# Patient Record
Sex: Male | Born: 1940 | Race: White | Hispanic: No | Smoking: Never smoker
Health system: Southern US, Community
[De-identification: ages and names within clinical notes are randomized; demographics above are authoritative.]

## PROBLEM LIST (undated history)

## (undated) DIAGNOSIS — R011 Cardiac murmur, unspecified: Secondary | ICD-10-CM

## (undated) DIAGNOSIS — M1990 Unspecified osteoarthritis, unspecified site: Secondary | ICD-10-CM

## (undated) DIAGNOSIS — A0472 Enterocolitis due to Clostridium difficile, not specified as recurrent: Secondary | ICD-10-CM

## (undated) DIAGNOSIS — Z950 Presence of cardiac pacemaker: Secondary | ICD-10-CM

## (undated) DIAGNOSIS — I350 Nonrheumatic aortic (valve) stenosis: Secondary | ICD-10-CM

## (undated) DIAGNOSIS — Z87442 Personal history of urinary calculi: Secondary | ICD-10-CM

## (undated) DIAGNOSIS — I10 Essential (primary) hypertension: Secondary | ICD-10-CM

## (undated) DIAGNOSIS — E119 Type 2 diabetes mellitus without complications: Secondary | ICD-10-CM

## (undated) DIAGNOSIS — G119 Hereditary ataxia, unspecified: Secondary | ICD-10-CM

## (undated) DIAGNOSIS — K5792 Diverticulitis of intestine, part unspecified, without perforation or abscess without bleeding: Secondary | ICD-10-CM

## (undated) HISTORY — PX: INSERT / REPLACE / REMOVE PACEMAKER: SUR710

## (undated) HISTORY — PX: CARDIAC CATHETERIZATION: SHX172

## (undated) HISTORY — PX: HERNIA REPAIR: SHX51

## (undated) HISTORY — PX: TONSILLECTOMY: SUR1361

## (undated) HISTORY — PX: SHOULDER ARTHROSCOPY WITH OPEN ROTATOR CUFF REPAIR: SHX6092

## (undated) HISTORY — PX: UMBILICAL HERNIA REPAIR: SHX196

## (undated) HISTORY — PX: COLECTOMY: SHX59

---
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
## 1997-06-03 ENCOUNTER — Ambulatory Visit (HOSPITAL_COMMUNITY): Admission: RE | Admit: 1997-06-03 | Discharge: 1997-06-03 | Payer: Self-pay | Admitting: Family Medicine

## 2016-08-06 ENCOUNTER — Encounter (HOSPITAL_COMMUNITY): Payer: Self-pay | Admitting: General Practice

## 2016-08-06 ENCOUNTER — Inpatient Hospital Stay (HOSPITAL_COMMUNITY)
Admission: AD | Admit: 2016-08-06 | Discharge: 2016-08-10 | DRG: 445 | Disposition: A | Discharge status: Home Health | Payer: Medicare Other | Source: Other Acute Inpatient Hospital | Attending: Internal Medicine | Admitting: Internal Medicine

## 2016-08-06 DIAGNOSIS — M19042 Primary osteoarthritis, left hand: Secondary | ICD-10-CM | POA: Diagnosis present

## 2016-08-06 DIAGNOSIS — I10 Essential (primary) hypertension: Secondary | ICD-10-CM | POA: Diagnosis present

## 2016-08-06 DIAGNOSIS — Z950 Presence of cardiac pacemaker: Secondary | ICD-10-CM | POA: Diagnosis not present

## 2016-08-06 DIAGNOSIS — Z66 Do not resuscitate: Secondary | ICD-10-CM | POA: Diagnosis present

## 2016-08-06 DIAGNOSIS — Z993 Dependence on wheelchair: Secondary | ICD-10-CM | POA: Diagnosis not present

## 2016-08-06 DIAGNOSIS — R1011 Right upper quadrant pain: Secondary | ICD-10-CM

## 2016-08-06 DIAGNOSIS — G119 Hereditary ataxia, unspecified: Secondary | ICD-10-CM | POA: Diagnosis present

## 2016-08-06 DIAGNOSIS — K810 Acute cholecystitis: Secondary | ICD-10-CM

## 2016-08-06 DIAGNOSIS — Z87442 Personal history of urinary calculi: Secondary | ICD-10-CM | POA: Diagnosis not present

## 2016-08-06 DIAGNOSIS — E119 Type 2 diabetes mellitus without complications: Secondary | ICD-10-CM | POA: Diagnosis present

## 2016-08-06 DIAGNOSIS — I350 Nonrheumatic aortic (valve) stenosis: Secondary | ICD-10-CM | POA: Diagnosis present

## 2016-08-06 DIAGNOSIS — D72829 Elevated white blood cell count, unspecified: Secondary | ICD-10-CM

## 2016-08-06 DIAGNOSIS — K8000 Calculus of gallbladder with acute cholecystitis without obstruction: Secondary | ICD-10-CM | POA: Diagnosis present

## 2016-08-06 DIAGNOSIS — E039 Hypothyroidism, unspecified: Secondary | ICD-10-CM | POA: Diagnosis present

## 2016-08-06 DIAGNOSIS — F17220 Nicotine dependence, chewing tobacco, uncomplicated: Secondary | ICD-10-CM | POA: Diagnosis present

## 2016-08-06 DIAGNOSIS — R011 Cardiac murmur, unspecified: Secondary | ICD-10-CM | POA: Diagnosis present

## 2016-08-06 DIAGNOSIS — K8020 Calculus of gallbladder without cholecystitis without obstruction: Secondary | ICD-10-CM | POA: Diagnosis not present

## 2016-08-06 HISTORY — DX: Cardiac murmur, unspecified: R01.1

## 2016-08-06 HISTORY — DX: Diverticulitis of intestine, part unspecified, without perforation or abscess without bleeding: K57.92

## 2016-08-06 HISTORY — DX: Nonrheumatic aortic (valve) stenosis: I35.0

## 2016-08-06 HISTORY — DX: Essential (primary) hypertension: I10

## 2016-08-06 HISTORY — DX: Personal history of urinary calculi: Z87.442

## 2016-08-06 HISTORY — DX: Enterocolitis due to Clostridium difficile, not specified as recurrent: A04.72

## 2016-08-06 HISTORY — DX: Unspecified osteoarthritis, unspecified site: M19.90

## 2016-08-06 HISTORY — DX: Presence of cardiac pacemaker: Z95.0

## 2016-08-06 HISTORY — DX: Type 2 diabetes mellitus without complications: E11.9

## 2016-08-06 HISTORY — DX: Hereditary ataxia, unspecified: G11.9

## 2016-08-06 LAB — GLUCOSE, CAPILLARY: GLUCOSE-CAPILLARY: 146 mg/dL — AB (ref 65–99)

## 2016-08-06 MED ORDER — INSULIN ASPART 100 UNIT/ML ~~LOC~~ SOLN
0.0000 [IU] | Freq: Three times a day (TID) | SUBCUTANEOUS | Status: DC
Start: 2016-08-07 — End: 2016-08-10
  Administered 2016-08-07: 2 [IU] via SUBCUTANEOUS
  Administered 2016-08-08 – 2016-08-09 (×2): 3 [IU] via SUBCUTANEOUS
  Administered 2016-08-09 – 2016-08-10 (×3): 2 [IU] via SUBCUTANEOUS

## 2016-08-06 MED ORDER — ONDANSETRON HCL 4 MG/2ML IJ SOLN: 4.0000 mg | Freq: Four times a day (QID) | INTRAMUSCULAR | Status: DC | PRN

## 2016-08-06 MED ORDER — GABAPENTIN 100 MG PO CAPS: 100.0000 mg | ORAL_CAPSULE | Freq: Three times a day (TID) | ORAL | Status: DC | PRN

## 2016-08-06 MED ORDER — SODIUM CHLORIDE 0.9 % IV SOLN
250.0000 mL | INTRAVENOUS | Status: DC | PRN
  Administered 2016-08-07: 250 mL via INTRAVENOUS

## 2016-08-06 MED ORDER — PIPERACILLIN-TAZOBACTAM 3.375 G IVPB
3.3750 g | Freq: Three times a day (TID) | INTRAVENOUS | Status: DC
  Administered 2016-08-07 – 2016-08-09 (×8): 3.375 g via INTRAVENOUS
  Filled 2016-08-06 (×9): qty 50

## 2016-08-06 MED ORDER — DIGOXIN 125 MCG PO TABS
0.2500 mg | ORAL_TABLET | Freq: Every day | ORAL | Status: DC
  Administered 2016-08-07 – 2016-08-10 (×4): 0.25 mg via ORAL
  Filled 2016-08-06 (×4): qty 2

## 2016-08-06 MED ORDER — MORPHINE SULFATE (PF) 2 MG/ML IV SOLN
2.0000 mg | INTRAVENOUS | Status: DC | PRN
  Administered 2016-08-08 (×2): 2 mg via INTRAVENOUS
  Filled 2016-08-06 (×2): qty 1

## 2016-08-06 MED ORDER — BOOST / RESOURCE BREEZE PO LIQD: 1.0000 | Freq: Three times a day (TID) | ORAL | Status: DC

## 2016-08-06 MED ORDER — ACETAMINOPHEN 325 MG PO TABS: 650.0000 mg | ORAL_TABLET | Freq: Four times a day (QID) | ORAL | Status: DC | PRN

## 2016-08-06 MED ORDER — LEVOTHYROXINE SODIUM 50 MCG PO TABS
50.0000 ug | ORAL_TABLET | Freq: Every day | ORAL | Status: DC
  Administered 2016-08-07 – 2016-08-10 (×3): 50 ug via ORAL
  Filled 2016-08-06 (×3): qty 1

## 2016-08-06 MED ORDER — AMLODIPINE BESYLATE 10 MG PO TABS
10.0000 mg | ORAL_TABLET | Freq: Every day | ORAL | Status: DC
  Administered 2016-08-07 – 2016-08-10 (×4): 10 mg via ORAL
  Filled 2016-08-06 (×4): qty 1

## 2016-08-06 MED ORDER — ACETAMINOPHEN 650 MG RE SUPP: 650.0000 mg | Freq: Four times a day (QID) | RECTAL | Status: DC | PRN

## 2016-08-06 MED ORDER — PIPERACILLIN-TAZOBACTAM 3.375 G IVPB 30 MIN
3.3750 g | INTRAVENOUS | Status: AC
  Administered 2016-08-07: 3.375 g via INTRAVENOUS
  Filled 2016-08-06: qty 50

## 2016-08-06 MED ORDER — SODIUM CHLORIDE 0.9% FLUSH
3.0000 mL | Freq: Two times a day (BID) | INTRAVENOUS | Status: DC
  Administered 2016-08-07 – 2016-08-10 (×5): 3 mL via INTRAVENOUS

## 2016-08-06 MED ORDER — SODIUM CHLORIDE 0.9% FLUSH
3.0000 mL | Freq: Two times a day (BID) | INTRAVENOUS | Status: DC
  Administered 2016-08-09: 3 mL via INTRAVENOUS

## 2016-08-06 MED ORDER — SODIUM CHLORIDE 0.9% FLUSH: 3.0000 mL | INTRAVENOUS | Status: DC | PRN

## 2016-08-06 MED ORDER — LATANOPROST 0.005 % OP SOLN
1.0000 [drp] | Freq: Every day | OPHTHALMIC | Status: DC
  Administered 2016-08-07 – 2016-08-08 (×2): 1 [drp] via OPHTHALMIC
  Filled 2016-08-06 (×2): qty 2.5

## 2016-08-06 MED ORDER — ENOXAPARIN SODIUM 40 MG/0.4ML ~~LOC~~ SOLN
40.0000 mg | Freq: Every day | SUBCUTANEOUS | Status: DC
  Administered 2016-08-07: 40 mg via SUBCUTANEOUS
  Filled 2016-08-06 (×2): qty 0.4

## 2016-08-06 MED ORDER — HYDROCODONE-ACETAMINOPHEN 5-325 MG PO TABS
1.0000 | ORAL_TABLET | ORAL | Status: DC | PRN
  Administered 2016-08-08 – 2016-08-09 (×4): 2 via ORAL
  Filled 2016-08-06 (×4): qty 2

## 2016-08-06 MED ORDER — INSULIN DETEMIR 100 UNIT/ML ~~LOC~~ SOLN
8.0000 [IU] | Freq: Every day | SUBCUTANEOUS | Status: DC
  Administered 2016-08-07 – 2016-08-09 (×4): 8 [IU] via SUBCUTANEOUS
  Filled 2016-08-06 (×5): qty 0.08

## 2016-08-06 NOTE — Progress Notes (Signed)
Pharmacy Antibiotic Note  [name] is a 76 y.o. male admitted on 08/06/2016 with intra-abd infection.  Pharmacy has been consulted for Zosyn dosing.  Plan: Zosyn 3.375gm IV now over 30 min then 3.375gm IV q8h - subsequent doses over 4 hours Will f/u micro data, renal function, and pt's clinical condition     No data recorded.  No results for input(s): WBC, CREATININE, LATICACIDVEN, VANCOTROUGH, VANCOPEAK, VANCORANDOM, GENTTROUGH, GENTPEAK, GENTRANDOM, TOBRATROUGH, TOBRAPEAK, TOBRARND, AMIKACINPEAK, AMIKACINTROU, AMIKACIN in the last 168 hours.  CrCl cannot be calculated (No order found.).    Not on File  Antimicrobials this admission: 6/16 Zosyn >>   Microbiology results: Pending  Thank you for allowing pharmacy to be a part of this patient's care.  Christoper Fabianaron Megean Fabio, PharmD, BCPS Clinical pharmacist, pager [phone] 08/06/2016 11:43 PM

## 2016-08-06 NOTE — H&P (Signed)
History and Physical    Benjamin Thornton ZOX:[id number] DOB: 1940/02/24 DOA: 08/06/2016  PCP: Rudi Heap, MD   Patient coming from: [facility]  Chief Complaint: Transfer for surgical consultation  HPI: Benjamin Thornton is a 76 y.o. 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  Symptoms have been present since Wednesday.  He has had nausea but no vomiting.  No fevers, chills, or sweats.  No shortness of breath, swelling, syncope.  He was concerned that he was having an episode of pancreatitis.  Work-up in the ED in [place] showed leukocytosis of 22.  Normal lipase.  Lactic acid 1.7.  U/A was negative for infection.  Chest xray showed low lung volumes and possible early edema.  Troponin negative.  EKG showed inferolateral T wave inversions with ST depressions.  RUQ ultrasound showed stones and sludge but no pericholecystic fluid, no wall thickening, and no sonographic Murphy's sign.  He receive IV zosyn and morphine for pain.  He is actually pain free at the time of admission to [facility].  Review of Systems: His caregiver (who is his ex-wife) reports an episode of transient decreased level of responsiveness while eating approximately two weeks ago.  The patient was pale but he did not lose consciousness.  He reported feeling "weird" for several seconds, but he could not localize his complaint or explain it any further.  No specific action was taken then.  Otherwise, 10 systems reviewed and negative.   Past Medical History:  Diagnosis Date  . Arthritis    "left knuckle" (08/06/2016)  . 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 C. difficile colitis   . Diabetes mellitus without  complication (HCC) dx'd 1990s   "I've never taken pills; I've always used the pen" (08/06/2016)  . Diverticulitis   . Heart murmur   . History of kidney stones    "passed them"  . Hypertension   . Presence of permanent cardiac pacemaker   . Severe aortic stenosis     Past Surgical History:  Procedure Laterality Date  . CARDIAC CATHETERIZATION    . COLECTOMY  1990s   partial; "12 inches; diverticulitis"  . HERNIA REPAIR    . INSERT / REPLACE / REMOVE PACEMAKER  2012; 01/2016  . SHOULDER ARTHROSCOPY WITH OPEN ROTATOR CUFF REPAIR Left   . TESTICLE SURGERY Right 1949   it wasn't descended  . TONSILLECTOMY    . UMBILICAL HERNIA REPAIR       reports that he has never smoked. His smokeless tobacco use includes Chew. He reports that he does not drink alcohol or use drugs.  He has one adult son.  ALLERGIES: Records from [place] indicate an iodine allergy but the patient (and his son) deny this.    Family History: His sister also has cerebellar ataxia.  Prior to Admission medications   Not on File  Reviewed in the records from the outside hospital.  Limited orders given.  Pharmacy will need to review.  Physical Exam: Vitals:   08/06/16 2039 08/07/16 0501  BP: (!) 134/48 (!) 118/45  Pulse: 70 61  Resp:  18  Temp: 99.6 F (37.6 C) 99.3 F (37.4 C)  TempSrc: Oral   SpO2: 100% 99%  Constitutional: NAD, calm, comfortable Vitals:   08/06/16 2039 08/07/16 0501  BP: (!) 134/48 (!) 118/45  Pulse: 70 61  Resp:  18  Temp: 99.6 F (37.6 C) 99.3 F (37.4 C)  TempSrc: Oral   SpO2: 100% 99%   Eyes: PERRL, lids and conjunctivae normal ENMT: Mucous membranes are moist. Posterior pharynx clear of any exudate or lesions. Normal dentition.  Neck: normal appearance, supple, no masses Respiratory: clear to auscultation bilaterally, no wheezing, no crackles. Normal respiratory effort. No accessory muscle use.  Cardiovascular: Normal rate, regular rhythm, Loud systolic murmur  heard throughout the precordium with radiation to his right carotid artery.  No extremity edema. 2+ pedal pulses. GI: abdomen is soft and compressible.  No distention.  No tenderness.  No guarding.  Bowel sounds are hypoactive. Musculoskeletal:  No joint deformity in upper and lower extremities. Good ROM, no contractures. Normal muscle tone.  Skin: no rashes, pale, cool, dry Neurologic: No apparent focal deficits. Psychiatric: Normal judgment and insight. Alert and oriented x 3. Normal mood.     Labs on Admission: I have personally reviewed following labs from the outside hospital.   Assessment/Plan Active Problems:   RUQ pain   Gallstones   Leukocytosis   Aortic stenosis   Cerebellar ataxia (HCC)   Pacemaker     RUQ pain with leukocytosis and abnormal GB on ultrasound.  No evidence of sepsis. --General Surgery consult called (Dr. Lindie SpruceWyatt).  Patient needs HIDA scan in the AM. --Continue empiric zosyn for now --Clear liquid diet then NPO after midnight --He is a higher risk surgical candidate with his history of AS  AS --Complete echo in the AM --Currently asymptomatic --No evidence of CHF  History of PPM (? Afib) --On digoxin at home but no anticoagulation except baby aspirin  HTN --Amlodipine, toprol  DM --Half dose of levemir while NPO  Hypothyroidism --levothyroxine  Cerebellar ataxia --wheelchair bound at baseline    DVT prophylaxis: Lovenox Code Status: DNR Family Communication: Son and caregiver (ex-wife) present at bedside at time of admission. Disposition Plan: To be determined. Consults called: General Surgery Lindie Spruce(Wyatt).   Admission status: Inpatient, telemetry.  I expect this patient will need inpatient services for greater than two midnights.   TIME SPENT: 60 minutes   Jerene Bearsarter,Kyeisha Janowicz Harrison MD [facility] Pager [phone]  If 7PM-7AM, please contact night-coverage [email or web address] Password [email or web address]  08/06/2016, 10:47 PM

## 2016-08-07 ENCOUNTER — Inpatient Hospital Stay (HOSPITAL_COMMUNITY): Payer: Medicare Other

## 2016-08-07 DIAGNOSIS — I350 Nonrheumatic aortic (valve) stenosis: Secondary | ICD-10-CM

## 2016-08-07 DIAGNOSIS — R1011 Right upper quadrant pain: Secondary | ICD-10-CM

## 2016-08-07 DIAGNOSIS — K8020 Calculus of gallbladder without cholecystitis without obstruction: Secondary | ICD-10-CM

## 2016-08-07 DIAGNOSIS — G119 Hereditary ataxia, unspecified: Secondary | ICD-10-CM

## 2016-08-07 LAB — CBC WITH DIFFERENTIAL/PLATELET
BASOS PCT: 1 %
Basophils Absolute: 0.1 10*3/uL (ref 0.0–0.1)
Eosinophils Absolute: 0.2 10*3/uL (ref 0.0–0.7)
Eosinophils Relative: 1 %
HEMATOCRIT: 37.5 % — AB (ref 39.0–52.0)
HEMOGLOBIN: 13.7 g/dL (ref 13.0–17.0)
LYMPHS ABS: 2.2 10*3/uL (ref 0.7–4.0)
LYMPHS PCT: 12 %
MCH: 31.2 pg (ref 26.0–34.0)
MCHC: 36.5 g/dL — AB (ref 30.0–36.0)
MCV: 85.4 fL (ref 78.0–100.0)
MONO ABS: 1.7 10*3/uL — AB (ref 0.1–1.0)
MONOS PCT: 10 %
NEUTROS ABS: 13.6 10*3/uL — AB (ref 1.7–7.7)
NEUTROS PCT: 76 %
Platelets: 151 10*3/uL (ref 150–400)
RBC: 4.39 MIL/uL (ref 4.22–5.81)
RDW: 14.2 % (ref 11.5–15.5)
WBC: 17.8 10*3/uL — ABNORMAL HIGH (ref 4.0–10.5)

## 2016-08-07 LAB — COMPREHENSIVE METABOLIC PANEL
ALBUMIN: 3.2 g/dL — AB (ref 3.5–5.0)
ALT: 106 U/L — AB (ref 17–63)
AST: 82 U/L — AB (ref 15–41)
Alkaline Phosphatase: 133 U/L — ABNORMAL HIGH (ref 38–126)
Anion gap: 8 (ref 5–15)
BUN: 21 mg/dL — AB (ref 6–20)
CHLORIDE: 106 mmol/L (ref 101–111)
CO2: 26 mmol/L (ref 22–32)
CREATININE: 1.33 mg/dL — AB (ref 0.61–1.24)
Calcium: 8.2 mg/dL — ABNORMAL LOW (ref 8.9–10.3)
GFR calc Af Amer: 58 mL/min — ABNORMAL LOW (ref >60)
GFR, EST NON AFRICAN AMERICAN: 50 mL/min — AB (ref >60)
GLUCOSE: 115 mg/dL — AB (ref 65–99)
POTASSIUM: 4.1 mmol/L (ref 3.5–5.1)
Sodium: 140 mmol/L (ref 135–145)
Total Bilirubin: 3 mg/dL — ABNORMAL HIGH (ref 0.3–1.2)
Total Protein: 6.2 g/dL — ABNORMAL LOW (ref 6.5–8.1)

## 2016-08-07 LAB — PROTIME-INR
INR: 1.36
Prothrombin Time: 16.9 seconds — ABNORMAL HIGH (ref 11.4–15.2)

## 2016-08-07 LAB — ECHOCARDIOGRAM COMPLETE
HEIGHTINCHES: 71 in
Weight: 2582.03 oz

## 2016-08-07 LAB — GLUCOSE, CAPILLARY
GLUCOSE-CAPILLARY: 85 mg/dL (ref 65–99)
GLUCOSE-CAPILLARY: 138 mg/dL — AB (ref 65–99)
Glucose-Capillary: 109 mg/dL — ABNORMAL HIGH (ref 65–99)
Glucose-Capillary: 138 mg/dL — ABNORMAL HIGH (ref 65–99)

## 2016-08-07 MED ORDER — SODIUM CHLORIDE 0.9 % IV SOLN
2.9000 mg/h | Freq: Once | INTRAVENOUS | Status: DC
  Administered 2016-08-07: 2.9 mg/h via INTRAVENOUS

## 2016-08-07 MED ORDER — DEXTROSE-NACL 5-0.45 % IV SOLN
INTRAVENOUS | Status: DC
  Administered 2016-08-07 – 2016-08-08 (×2): 1000 mL via INTRAVENOUS

## 2016-08-07 MED ORDER — TECHNETIUM TC 99M MEBROFENIN IV KIT
5.4400 | PACK | Freq: Once | INTRAVENOUS | Status: AC | PRN
  Administered 2016-08-07: 5.44 via INTRAVENOUS

## 2016-08-07 MED ORDER — DEXTROSE-NACL 5-0.45 % IV SOLN
INTRAVENOUS | Status: DC
  Administered 2016-08-07: 06:00:00 via INTRAVENOUS

## 2016-08-07 MED ORDER — MORPHINE SULFATE (PF) 4 MG/ML IV SOLN
INTRAVENOUS | Status: AC
  Filled 2016-08-07: qty 1

## 2016-08-07 MED ORDER — ENSURE ENLIVE PO LIQD
237.0000 mL | Freq: Two times a day (BID) | ORAL | Status: DC
  Administered 2016-08-08 – 2016-08-10 (×4): 237 mL via ORAL

## 2016-08-07 NOTE — Progress Notes (Signed)
Uvaldo Bristleatjana, RN called from [facility] with results of blood culture.  Results indicated gram positive cocci findings. Adline MangoIvana, RN requested lab results to be faxed to [facility]. Fax number provided to Uvaldo Bristleatjana, RN she will request paperwork from lab and sent it to the unit.

## 2016-08-07 NOTE — Progress Notes (Signed)
PROGRESS NOTE    Benjamin Thornton  [id number] DOB: [birth year] DOA: 08/06/2016 PCP: Rudi Thornton, [name], MD  Brief Narrative:Benjamin Thornton is a 76 y.o. 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 RUQ ultrasound showed stones and sludge but no pericholecystic fluid, no wall thickening, and no sonographic Murphy's sign.     Assessment & Plan: 1. Cholelithiasis with possible acute cholecystitis -Appreciate surgical consult -HIDA scan today, surgery recommended percutaneous drainage if cystic duct obstructed, not felt to be a surgical candidate because of severe aortic stenosis  2. Severe aortic stenosis  -Followed by a cardiologist in [place] -Managed conservatively due to her overall poor prognosis with debility and multiple medical problems -Follow-up repeat echo--Currently asymptomatic --No evidence of CHF -History of PPM  --On digoxin at home.  3. HTN --Amlodipine, toprol  4. DM --Half dose of levemir while NPO, SSI, CBGs stable  5. 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   DVT prophylaxis: Lovenox Code Status: DNR Family Communication: Son and caregiver (ex-wife) present at bedside  Dispo: pending stability and above Rx  Consultants:   ccs  Antimicrobials:   Zosyn   Subjective: abd pain better, no N/V, no dyspnea or chest pain  Objective: Vitals:   08/06/16 2039 08/07/16 0501  BP: (!) 134/48 (!) 118/45  Pulse: 70 61  Resp:  18  Temp: 99.6 F (37.6 C) 99.3 F (37.4 C)  TempSrc: Oral   SpO2: 100% 99%  Weight:  73.2 kg (161 lb 6 oz)  Height:  5\' 11"  (1.803 m)    Intake/Output Summary (Last 24 hours) at 08/07/16 1148 Last data filed at 08/07/16 1017  Gross per 24 hour  Intake           208.16 ml  Output                0 ml  Net           208.16 ml   Filed Weights   08/07/16 0501  Weight: 73.2 kg (161 lb 6 oz)    Examination:  General exam: Appears calm and comfortable  Respiratory system: Clear to auscultation. Respiratory effort normal. Cardiovascular system: S1 & S2 heard, garde 4 systolic murmur Gastrointestinal system: Abdomen is nondistended, soft and tender in RUQ. Normal bowel sounds heard. Central nervous system: Alert and oriented. chronicincoordination Extremities: Symmetric 5 x 5 power. Skin: No rashes, lesions or ulcers Psychiatry: Judgement and insight appear normal. Mood & affect appropriate.     Data Reviewed:   CBC:  Recent Labs Lab 08/07/16 0656  WBC 17.8*  NEUTROABS 13.6*  HGB 13.7  HCT 37.5*  MCV 85.4  PLT 151   Basic Metabolic Panel:  Recent Labs Lab 08/07/16 0656  NA 140  K 4.1  CL 106  CO2 26  GLUCOSE 115*  BUN 21*  CREATININE 1.33*  CALCIUM 8.2*   GFR: Estimated Creatinine Clearance: 48.2 mL/min (A) (by C-G formula based on SCr of 1.33 mg/dL (H)). Liver Function Tests:  Recent Labs Lab 08/07/16 0656  AST 82*  ALT 106*  ALKPHOS 133*  BILITOT 3.0*  PROT 6.2*  ALBUMIN 3.2*   No results for input(s): LIPASE, AMYLASE in the last 168 hours. No results for input(s): AMMONIA in the last 168 hours. Coagulation Profile:  Recent Labs Lab 08/07/16 0656  INR 1.36   Cardiac Enzymes: No results for input(s): CKTOTAL, CKMB, CKMBINDEX, TROPONINI in the last 168 hours. BNP (last 3 results) No results for input(s): PROBNP in the last 8760 hours. HbA1C: No results for input(s): HGBA1C in the last 72 hours. CBG:  Recent Labs Lab 08/06/16 2310 08/07/16 0833  GLUCAP 146* 109*   Lipid Profile: No results for input(s): CHOL, HDL, LDLCALC, TRIG, CHOLHDL, LDLDIRECT in the last 72 hours. Thyroid Function Tests: No results for input(s): TSH, T4TOTAL, FREET4, T3FREE, THYROIDAB in  the last 72 hours. Anemia Panel: No results for input(s): VITAMINB12, FOLATE, FERRITIN, TIBC, IRON, RETICCTPCT in the last 72 hours. Urine analysis: No results found for: COLORURINE, APPEARANCEUR, LABSPEC, PHURINE, GLUCOSEU, HGBUR, BILIRUBINUR, KETONESUR, PROTEINUR, UROBILINOGEN, NITRITE, LEUKOCYTESUR Sepsis Labs: @LABRCNTIP (procalcitonin:4,lacticidven:4)  )No results found for this or any previous visit (from the past 240 hour(s)).       Radiology Studies: No results found.      Scheduled Meds: . amLODipine  10 mg Oral Daily  . digoxin  0.25 mg Oral Daily  . enoxaparin (LOVENOX) injection  40 mg Subcutaneous QHS  . feeding supplement  1 Container Oral TID BM  . insulin aspart  0-15 Units Subcutaneous TID WC  . insulin detemir  8 Units Subcutaneous QHS  . latanoprost  1 drop Both Eyes QHS  . levothyroxine  50 mcg Oral QAC breakfast  . sodium chloride flush  3 mL Intravenous Q12H  . sodium chloride flush  3 mL Intravenous Q12H   Continuous Infusions: . sodium chloride 250 mL (08/07/16 0000)  . dextrose 5 % and 0.45% NaCl 1,000 mL (08/07/16 1021)  . piperacillin-tazobactam (ZOSYN)  IV Stopped (08/07/16 1143)     LOS: 1 day    Time spent:    Zannie Cove, MD [facility] Pager [phone]  If 7PM-7AM, please contact night-coverage [email or web address] Password [facility] 08/07/2016, 11:48 AM

## 2016-08-07 NOTE — Consult Note (Signed)
Reason for Consult:Gallstones and RUQ pain Referring Physician: [name] is an 76 y.o. male.  HPI: Abdominal pain, has had before, this time is worse.  US shows stone with sludge, no thickening or pericholecystic fluid.    Past Medical History:  Diagnosis Date  . Arthritis    "left knuckle" (08/06/2016)  . 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 C. difficile colitis   . Diabetes mellitus without complication ([place]) dx'd 3419Q   "I've never taken pills; I've always used the pen" (08/06/2016)  . Diverticulitis   . Heart murmur   . History of kidney stones    "passed them"  . Hypertension   . Presence of permanent cardiac pacemaker   . Severe aortic stenosis     Past Surgical History:  Procedure Laterality Date  . CARDIAC CATHETERIZATION    . COLECTOMY  1990s   partial; "12 inches; diverticulitis"  . HERNIA REPAIR    . INSERT / REPLACE / REMOVE PACEMAKER  2012; 01/2016  . SHOULDER ARTHROSCOPY WITH OPEN ROTATOR CUFF REPAIR Left   . TESTICLE SURGERY Right 1949   it wasn't descended  . TONSILLECTOMY    . UMBILICAL HERNIA REPAIR      History reviewed. No pertinent family history.  Social History:  reports that he has never smoked. His smokeless tobacco use includes Chew. He reports that he does not drink alcohol or use drugs.  Allergies: Not on File  Medications: I have reviewed the patient's current medications.  Results for orders placed or performed during the hospital encounter of 08/06/16 (from the past 48 hour(s))  Glucose, capillary     Status: Abnormal   Collection Time: 08/06/16 11:10 PM  Result Value Ref Range   Glucose-Capillary 146 (H) 65 - 99 mg/dL  Comprehensive metabolic panel     Status: Abnormal   Collection Time: 08/07/16  6:56 AM  Result Value Ref Range   Sodium 140 135 - 145 mmol/L   Potassium 4.1 3.5 - 5.1 mmol/L   Chloride 106 101 - 111 mmol/L   CO2 26 22 - 32 mmol/L   Glucose, Bld 115 (H) 65 -  99 mg/dL   BUN 21 (H) 6 - 20 mg/dL   Creatinine, Ser 1.33 (H) 0.61 - 1.24 mg/dL   Calcium 8.2 (L) 8.9 - 10.3 mg/dL   Total Protein 6.2 (L) 6.5 - 8.1 g/dL   Albumin 3.2 (L) 3.5 - 5.0 g/dL   AST 82 (H) 15 - 41 U/L   ALT 106 (H) 17 - 63 U/L   Alkaline Phosphatase 133 (H) 38 - 126 U/L   Total Bilirubin 3.0 (H) 0.3 - 1.2 mg/dL   GFR calc non Af Amer 50 (L) >60 mL/min   GFR calc Af Amer 58 (L) >60 mL/min    Comment: (NOTE) The eGFR has been calculated using the CKD EPI equation. This calculation has not been validated in all clinical situations. eGFR's persistently <60 mL/min signify possible Chronic Kidney Disease.    Anion gap 8 5 - 15  Protime-INR     Status: Abnormal   Collection Time: 08/07/16  6:56 AM  Result Value Ref Range   Prothrombin Time 16.9 (H) 11.4 - 15.2 seconds   INR 1.36   CBC WITH DIFFERENTIAL     Status: Abnormal   Collection Time: 08/07/16  6:56 AM  Result Value Ref Range   WBC 17.8 (H) 4.0 - 10.5 K/uL   RBC  4.39 4.22 - 5.81 MIL/uL   Hemoglobin 13.7 13.0 - 17.0 g/dL   HCT 37.5 (L) 39.0 - 52.0 %   MCV 85.4 78.0 - 100.0 fL   MCH 31.2 26.0 - 34.0 pg   MCHC 36.5 (H) 30.0 - 36.0 g/dL   RDW 14.2 11.5 - 15.5 %   Platelets 151 150 - 400 K/uL   Neutrophils Relative % 76 %   Neutro Abs 13.6 (H) 1.7 - 7.7 K/uL   Lymphocytes Relative 12 %   Lymphs Abs 2.2 0.7 - 4.0 K/uL   Monocytes Relative 10 %   Monocytes Absolute 1.7 (H) 0.1 - 1.0 K/uL   Eosinophils Relative 1 %   Eosinophils Absolute 0.2 0.0 - 0.7 K/uL   Basophils Relative 1 %   Basophils Absolute 0.1 0.0 - 0.1 K/uL    No results found.  Review of Systems  Constitutional: Negative for chills and fever.  Gastrointestinal: Positive for abdominal pain and nausea.   Blood pressure (!) 118/45, pulse 61, temperature 99.3 F (37.4 C), resp. rate 18, height '5\' 11"'  (1.803 m), weight 73.2 kg (161 lb 6 oz), SpO2 99 %. Physical Exam  Constitutional: He is oriented to person, place, and time. He appears  well-developed and well-nourished.  HENT:  Head: Normocephalic and atraumatic.  Eyes: EOM are normal. Pupils are equal, round, and reactive to light.  Neck: Normal range of motion.  Cardiovascular: Normal rate and regular rhythm.   Murmur (Grade IV/VI systolic murmur) heard. Has pacemaker.  Respiratory: Effort normal and breath sounds normal.  GI: Soft. Normal appearance and bowel sounds are normal. There is tenderness in the right upper quadrant. There is positive Murphy's sign.  Musculoskeletal: Normal range of motion.  Neurological: He is alert and oriented to person, place, and time.  Slurred speech, chronic neurological disorder, cerebellar degeneration  Skin: Skin is warm and dry.  Psychiatric: He has a normal mood and affect. His behavior is normal. Judgment and thought content normal.    Assessment/Plan: Possible acute cholecystitis with critical aortic stenosis.  Definite cholelithiasis Not an operative candidate because of his critical AS. If HIDA scan today shows evidence of cystic ductal obstruction, would get percutaneous drainage  And treat with IV antibiotics. If cystic duct is patent, would treat solely with antibiotics as long as the WBC is improving.  Benjamin Thornton 08/07/2016, 7:53 AM

## 2016-08-07 NOTE — Progress Notes (Signed)
Per lab and RN from [facility] only one out of four bottles from collected blood culture was positive. Pt currently on Zosyn. No changes in therapy at this time per Katrinka BlazingSmith, MD.

## 2016-08-07 NOTE — Progress Notes (Signed)
Patient arrived to the unit via stretch from [facility].  Family at bedside which included his ex wife (caregiver) and son. Patient is alert and oriented x 4.  No complaints of pain. Skin assessment complete.  Patient sacrum red but blanchable.  Foam placed for protection.  Iv intact to the right upper arm.  Vital signs complete. Educated the family and patient the importance of bed alarm. Educated the patient on how to reach the staff on the unit. Lowered the bed, activated the bed alarm and placed the call light within reach.  Will continue to monitor the patient

## 2016-08-07 NOTE — Progress Notes (Signed)
   Subjective/Chief Complaint: Pt off floor at HIDA scan   Objective: Vital signs in last 24 hours: Temp:  [99.3 F (37.4 C)-99.6 F (37.6 C)] 99.3 F (37.4 C) (06/16 0501) Pulse Rate:  [61-70] 61 (06/16 0501) Resp:  [18] 18 (06/16 0501) BP: (118-134)/(45-48) 118/45 (06/16 0501) SpO2:  [99 %-100 %] 99 % (06/16 0501) Weight:  [73.2 kg (161 lb 6 oz)] 73.2 kg (161 lb 6 oz) (06/16 0501) Last BM Date: 08/06/16  Intake/Output from previous day: 06/15 0701 - 06/16 0700 In: 145.3 [I.V.:145.3] Out: -  Intake/Output this shift: Total I/O In: 62.8 [P.O.:30; I.V.:32.8] Out: -    Lab Results:   Recent Labs  08/07/16 0656  WBC 17.8*  HGB 13.7  HCT 37.5*  PLT 151   BMET  Recent Labs  08/07/16 0656  NA 140  K 4.1  CL 106  CO2 26  GLUCOSE 115*  BUN 21*  CREATININE 1.33*  CALCIUM 8.2*   PT/INR  Recent Labs  08/07/16 0656  LABPROT 16.9*  INR 1.36   Anti-infectives: Anti-infectives    Start     Dose/Rate Route Frequency Ordered Stop   08/07/16 0800  piperacillin-tazobactam (ZOSYN) IVPB 3.375 g     3.375 g 12.5 mL/hr over 240 Minutes Intravenous Every 8 hours 08/06/16 2345     08/07/16 0000  piperacillin-tazobactam (ZOSYN) IVPB 3.375 g     3.375 g 100 mL/hr over 30 Minutes Intravenous STAT 08/06/16 2345 08/07/16 0043      Assessment/Plan: ?acute cholecystitis HIDA currenty ongoing.  If positive pt will need per drain per IR since pt is a poor surgical candidate. Will follow along  LOS: 1 day    Marigene EhlersRamirez Jr., [facility] 08/07/2016

## 2016-08-07 NOTE — Progress Notes (Signed)
  Echocardiogram 2D Echocardiogram has been performed.  Ronson Hagins 08/07/2016, 9:45 AM

## 2016-08-07 NOTE — Progress Notes (Signed)
Initial Nutrition Assessment  DOCUMENTATION CODES:  Not applicable  INTERVENTION:  D/C breeze,   Ensure Enlive po BID, each supplement provides 350 kcal and 20 grams of protein  NUTRITION DIAGNOSIS:  Inadequate oral intake related to inability to eat, nausea, poor appetite as evidenced by per patient/family report of having essentially no intake since Wednesday   GOAL:  Patient will meet greater than or equal to 90% of their needs  MONITOR:  PO intake, Supplement acceptance, Diet advancement, Labs, I & O's  REASON FOR ASSESSMENT:  Malnutrition Screening Tool    ASSESSMENT:  [age] y/o male PMHx Aortic Stenosis, PPM implant, HTN, DM, 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 Transferred from OSH due to RUQ pain associated with pleuritic chest pain since Wednesday. Also has had vomiting. Worked up for likely cholecystitis. Not surgical candidate, may get drain placed.    Pt reports that he was roughly at his normal up until Wednesday when "everything stopped". He developed severe nausea and has had little to eat since then. At baseline, he drinks 2-3 Ensure supplements a day depending on how well he eats. He denies any Constipation or diarrhea. He did not take any vitamins at home.   He says his UBW recently has been 160 lbs. Denies any recent wt changes.   At this time, family anxiously waiting on results from scan. Agreeable to supplements. Declined any other interventions  Physical Exam: WDL  Labs: Gly 85-140, WBC:17.8, Albumin: 3.2, BUN/Creat:21/1.33 Meds: Insulin, Resource breeze, IVF, IV abx   Recent Labs Lab 08/07/16 0656  NA 140  K 4.1  CL 106  CO2 26  BUN 21*  CREATININE 1.33*  CALCIUM 8.2*  GLUCOSE 115*   Diet Order:  Diet clear liquid Room service appropriate? Yes; Fluid consistency: Thin  Skin:  Reviewed, no issues  Last BM:  6/15  Height:  Ht Readings from Last 1 Encounters:  08/07/16 5\' 11"  (1.803 m)   Weight:  Wt Readings from Last 1  Encounters:  08/07/16 161 lb 6 oz (73.2 kg)   Ideal Body Weight:  78.18 kg  BMI:  Body mass index is 22.51 kg/m.  Estimated Nutritional Needs:  Kcal:  1750-1950 (24-27 kcal/kg bw) Protein:  73-88 g Pro (1-1.2 g/kg bw) Fluid:  1.8-2 L fluid  EDUCATION NEEDS:  No education needs identified at this time  Christophe LouisNathan Franks RD, LDN, CNSC Clinical Nutrition Pager: [phone] 08/07/2016 5:05 PM

## 2016-08-08 ENCOUNTER — Inpatient Hospital Stay (HOSPITAL_COMMUNITY): Payer: Medicare Other

## 2016-08-08 ENCOUNTER — Encounter (HOSPITAL_COMMUNITY): Payer: Self-pay | Admitting: Radiology

## 2016-08-08 DIAGNOSIS — K810 Acute cholecystitis: Secondary | ICD-10-CM

## 2016-08-08 HISTORY — PX: IR PERC CHOLECYSTOSTOMY: IMG2326

## 2016-08-08 LAB — GLUCOSE, CAPILLARY
Glucose-Capillary: 90 mg/dL (ref 65–99)
Glucose-Capillary: 115 mg/dL — ABNORMAL HIGH (ref 65–99)
Glucose-Capillary: 151 mg/dL — ABNORMAL HIGH (ref 65–99)
Glucose-Capillary: 182 mg/dL — ABNORMAL HIGH (ref 65–99)

## 2016-08-08 LAB — COMPREHENSIVE METABOLIC PANEL
ALK PHOS: 101 U/L (ref 38–126)
ALT: 60 U/L (ref 17–63)
ANION GAP: 7 (ref 5–15)
AST: 28 U/L (ref 15–41)
Albumin: 2.9 g/dL — ABNORMAL LOW (ref 3.5–5.0)
BILIRUBIN TOTAL: 2.1 mg/dL — AB (ref 0.3–1.2)
BUN: 15 mg/dL (ref 6–20)
CALCIUM: 8.2 mg/dL — AB (ref 8.9–10.3)
CO2: 25 mmol/L (ref 22–32)
CREATININE: 1.1 mg/dL (ref 0.61–1.24)
Chloride: 107 mmol/L (ref 101–111)
GFR calc non Af Amer: >60 mL/min (ref >60)
GLUCOSE: 86 mg/dL (ref 65–99)
Potassium: 4.3 mmol/L (ref 3.5–5.1)
SODIUM: 139 mmol/L (ref 135–145)
TOTAL PROTEIN: 5.9 g/dL — AB (ref 6.5–8.1)

## 2016-08-08 LAB — GRAM STAIN

## 2016-08-08 LAB — CBC
HEMATOCRIT: 35.7 % — AB (ref 39.0–52.0)
HEMOGLOBIN: 13 g/dL (ref 13.0–17.0)
MCH: 31 pg (ref 26.0–34.0)
MCHC: 36.4 g/dL — AB (ref 30.0–36.0)
MCV: 85 fL (ref 78.0–100.0)
Platelets: 155 10*3/uL (ref 150–400)
RBC: 4.2 MIL/uL — AB (ref 4.22–5.81)
RDW: 13.9 % (ref 11.5–15.5)
WBC: 14.1 10*3/uL — ABNORMAL HIGH (ref 4.0–10.5)

## 2016-08-08 MED ORDER — IOPAMIDOL (ISOVUE-300) INJECTION 61%
INTRAVENOUS | Status: AC
  Administered 2016-08-08: 10 mL
  Filled 2016-08-08: qty 50

## 2016-08-08 MED ORDER — MIDAZOLAM HCL 2 MG/2ML IJ SOLN
INTRAMUSCULAR | Status: AC | PRN
  Administered 2016-08-08: 1 mg via INTRAVENOUS

## 2016-08-08 MED ORDER — DEXTROSE-NACL 5-0.45 % IV SOLN
INTRAVENOUS | Status: DC
  Administered 2016-08-08: 800 mL via INTRAVENOUS

## 2016-08-08 MED ORDER — LIDOCAINE HCL (PF) 1 % IJ SOLN
INTRAMUSCULAR | Status: AC
  Filled 2016-08-08: qty 30

## 2016-08-08 MED ORDER — FENTANYL CITRATE (PF) 100 MCG/2ML IJ SOLN
INTRAMUSCULAR | Status: AC | PRN
  Administered 2016-08-08: 50 ug via INTRAVENOUS

## 2016-08-08 MED ORDER — FENTANYL CITRATE (PF) 100 MCG/2ML IJ SOLN
INTRAMUSCULAR | Status: AC
  Filled 2016-08-08: qty 2

## 2016-08-08 MED ORDER — MIDAZOLAM HCL 2 MG/2ML IJ SOLN
INTRAMUSCULAR | Status: AC
  Filled 2016-08-08: qty 4

## 2016-08-08 NOTE — Progress Notes (Signed)
[facility] Progress Note     Subjective: CC:  Denies abdominal pain. IR at bedside discussing perc drain. Denies fever, chills, nausea, vomiting.   Objective: Vital signs in last 24 hours: Temp:  [98.1 F (36.7 C)-99.1 F (37.3 C)] 98.7 F (37.1 C) (06/17 0525) Pulse Rate:  [62-67] 62 (06/17 0525) Resp:  [17-18] 18 (06/17 0525) BP: (118-124)/(57-68) 118/57 (06/17 0525) SpO2:  [96 %-100 %] 100 % (06/17 0525) Last BM Date: 08/06/16  Intake/Output from previous day: 06/16 0701 - 06/17 0700 In: 1841 [P.O.:150; I.V.:1491; IV Piggyback:200] Out: -  Intake/Output this shift: No intake/output data recorded.  PE: Gen:  Alert, NAD, pleasant Card:  Regular rate and rhythm, pedal pulses 2+ BL  Pulm:  Normal effort, clear to auscultation bilaterally Abd: Soft, non-tender, non-distended, bowel sounds present in all 4 quadrants Skin: warm and dry, no rashes  Psych: A&Ox3   Lab Results:   Recent Labs  08/07/16 0656  WBC 17.8*  HGB 13.7  HCT 37.5*  PLT 151   BMET  Recent Labs  08/07/16 0656  NA 140  K 4.1  CL 106  CO2 26  GLUCOSE 115*  BUN 21*  CREATININE 1.33*  CALCIUM 8.2*   PT/INR  Recent Labs  08/07/16 0656  LABPROT 16.9*  INR 1.36   CMP     Component Value Date/Time   NA 140 08/07/2016 0656   K 4.1 08/07/2016 0656   CL 106 08/07/2016 0656   CO2 26 08/07/2016 0656   GLUCOSE 115 (H) 08/07/2016 0656   BUN 21 (H) 08/07/2016 0656   CREATININE 1.33 (H) 08/07/2016 0656   CALCIUM 8.2 (L) 08/07/2016 0656   PROT 6.2 (L) 08/07/2016 0656   ALBUMIN 3.2 (L) 08/07/2016 0656   AST 82 (H) 08/07/2016 0656   ALT 106 (H) 08/07/2016 0656   ALKPHOS 133 (H) 08/07/2016 0656   BILITOT 3.0 (H) 08/07/2016 0656   GFRNONAA 50 (L) 08/07/2016 0656   GFRAA 58 (L) 08/07/2016 0656   Lipase  No results found for: LIPASE     Studies/Results: Nm Hepatobiliary Including Gb  Result Date: 08/07/2016 CLINICAL DATA:  Right upper quadrant pain. Epigastric and  right upper quadrant pain since Wednesday. Nausea. No vomiting. Leukocytosis. White count 28 22,000. Ultrasound shows sludge and stones. EXAM: NUCLEAR MEDICINE HEPATOBILIARY IMAGING TECHNIQUE: Sequential images of the abdomen were obtained out to 60 minutes following intravenous administration of radiopharmaceutical. RADIOPHARMACEUTICALS:  5.44 mCi Tc-66m  Choletec IV COMPARISON:  Ultrasound of the abdomen 08/06/2016 FINDINGS: Prompt uptake and biliary excretion of activity by the liver is seen. Activity is identified within the bowel loops as early as 20 minutes. However there is no activity within the gallbladder at 60 minutes. 2.9 mg of morphine was administered and subsequent imaging to 30 minutes demonstrates no gallbladder activity. IMPRESSION: Findings are consistent with acute cholecystitis. Electronically Signed   By: Norva Pavlov M.D.   On: 08/07/2016 16:25    Anti-infectives: Anti-infectives    Start     Dose/Rate Route Frequency Ordered Stop   08/07/16 0800  piperacillin-tazobactam (ZOSYN) IVPB 3.375 g     3.375 g 12.5 mL/hr over 240 Minutes Intravenous Every 8 hours 08/06/16 2345     08/07/16 0000  piperacillin-tazobactam (ZOSYN) IVPB 3.375 g     3.375 g 100 mL/hr over 30 Minutes Intravenous STAT 08/06/16 2345 08/07/16 0043     Assessment/Plan acute cholecystitis - HIDA 6/16 positive  - poor surgical candidate 2/2 severe aortic stenosis >> Perc cholecystostomy  tube ordered. - we will follow   FEN: NPO ID: [id number]  VTE: SCD's     LOS: 2 days    Adam PhenixElizabeth S Jaimeson Gopal , [facility] 08/08/2016, 8:25 AM Pager: [phone] Consults: [phone] Mon-Fri 7:00 am-4:30 pm Sat-Sun 7:00 am-11:30 am

## 2016-08-08 NOTE — Progress Notes (Signed)
Patient back from IR. Drainage tube on right upper quadrant to gravity. Dressing intact, clean, dry. Patient complaining of pain on RUQ (9/10). Will continue to monitor.

## 2016-08-08 NOTE — Procedures (Signed)
Interventional Radiology Procedure Note  Procedure: Image guided percutaneous cholecystostomy.   Complications: None Recommendations:  - To gravity drain.  Daily sterile flushes.  - Do not submerge  - Routine drain and wound care - Can follow up with VIR drain clinic in minimum 6 weeks to determine patency of ductal system.  - Education for drain care.  - Follow up with surgery to determine candidacy for interval cholecystectomy   Signed,  Yvone NeuJaime S. Loreta AveWagner, DO

## 2016-08-08 NOTE — Progress Notes (Signed)
Chief Complaint: Patient was seen in consultation today for perc chole drain at the request of Dr. Zannie Cove  Referring Physician(s): Dr. Zannie Cove  Supervising Physician: Gilmer Mor  Patient Status: [facility] - In-pt  History of Present Illness: Benjamin Thornton is a 76 y.o. male with RUQ abd pain. He went to [facility] ER and was found to have gallstones and leukocytosis by [place]. He has underlying hx of severe aortic stenosis so he was transferred to [facility] for continued workup and care. He has been seen by surgery and HIDA scan was ordered. This shows non-visualization of the gallbladder c/w cholecystitis. Given his comorbidities, he is not a surgical candidate. IR is asked to place perc chole drain. PMHx, meds, imaging, labs, allergies reviewed. Pt feeling a little better this am, has been on IV abx since admission. Ex-Wife (Caregiver) at bedside.  Past Medical History:  Diagnosis Date  . Arthritis    "left knuckle" (08/06/2016)  . 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 C. difficile colitis   . Diabetes mellitus without complication (HCC) dx'd 1990s   "I've never taken pills; I've always used the pen" (08/06/2016)  . Diverticulitis   . Heart murmur   . History of kidney stones    "passed them"  . Hypertension   . Presence of permanent cardiac pacemaker   . Severe aortic stenosis     Past Surgical History:  Procedure Laterality Date  . CARDIAC CATHETERIZATION    . COLECTOMY  1990s   partial; "12 inches; diverticulitis"  . HERNIA REPAIR    . INSERT / REPLACE / REMOVE PACEMAKER  2012; 01/2016  . SHOULDER ARTHROSCOPY WITH OPEN ROTATOR CUFF REPAIR Left   . TESTICLE SURGERY Right 1949   it wasn't descended  . TONSILLECTOMY    . UMBILICAL HERNIA REPAIR      Allergies: Patient has no allergy information on record.  Medications:  Current Facility-Administered Medications:  .  0.9 %  sodium chloride infusion, 250 mL,  Intravenous, PRN, Michael Litter, MD, Last Rate: 10 mL/hr at 08/07/16 0000, 250 mL at 08/07/16 0000 .  acetaminophen (TYLENOL) tablet 650 mg, 650 mg, Oral, Q6H PRN **OR** acetaminophen (TYLENOL) suppository 650 mg, 650 mg, Rectal, Q6H PRN, Michael Litter, MD .  amLODipine (NORVASC) tablet 10 mg, 10 mg, Oral, Daily, Michael Litter, MD, 10 mg at 08/07/16 1416 .  dextrose 5 %-0.45 % sodium chloride infusion, , Intravenous, Continuous, Zannie Cove, MD, Last Rate: 75 mL/hr at 08/07/16 1021, 1,000 mL at 08/07/16 1021 .  digoxin (LANOXIN) tablet 0.25 mg, 0.25 mg, Oral, Daily, Michael Litter, MD, 0.25 mg at 08/07/16 1416 .  feeding supplement (ENSURE ENLIVE) (ENSURE ENLIVE) liquid 237 mL, 237 mL, Oral, BID BM, Zannie Cove, MD .  gabapentin (NEURONTIN) capsule 100 mg, 100 mg, Oral, TID PRN, Michael Litter, MD .  HYDROcodone-acetaminophen (NORCO/VICODIN) 5-325 MG per tablet 1-2 tablet, 1-2 tablet, Oral, Q4H PRN, Michael Litter, MD .  insulin aspart (novoLOG) injection 0-15 Units, 0-15 Units, Subcutaneous, TID WC, Michael Litter, MD, 2 Units at 08/07/16 1752 .  insulin detemir (LEVEMIR) injection 8 Units, 8 Units, Subcutaneous, QHS, Michael Litter, MD, 8 Units at 08/07/16 2255 .  latanoprost (XALATAN) 0.005 % ophthalmic solution 1 drop, 1 drop, Both Eyes, QHS, Michael Litter, MD, 1 drop at 08/07/16 0045 .  levothyroxine (SYNTHROID, LEVOTHROID) tablet 50 mcg, 50 mcg, Oral, QAC breakfast, Michael Litter, MD, 50 mcg at 08/07/16 [phone] .  morphine  2 MG/ML injection 2 mg, 2 mg, Intravenous, Q2H PRN, Michael Litter, MD .  ondansetron [facility]) injection 4 mg, 4 mg, Intravenous, Q6H PRN, Michael Litter, MD .  piperacillin-tazobactam (ZOSYN) IVPB 3.375 g, 3.375 g, Intravenous, Q8H, Titus Mould, RPH, Stopped at 08/08/16 1610 .  sodium chloride flush (NS) 0.9 % injection 3 mL, 3 mL, Intravenous, Q12H, Michael Litter, MD .  sodium chloride flush (NS) 0.9 % injection 3 mL, 3 mL, Intravenous, Q12H, Michael Litter, MD, 3 mL at  08/07/16 2257 .  sodium chloride flush (NS) 0.9 % injection 3 mL, 3 mL, Intravenous, PRN, Michael Litter, MD    History reviewed. No pertinent family history.  Social History   Social History  . Marital status: Divorced    Spouse name: N/A  . Number of children: N/A  . Years of education: N/A   Social History Main Topics  . Smoking status: Never Smoker  . Smokeless tobacco: Current User    Types: Chew  . Alcohol use No  . Drug use: No  . Sexual activity: Not Asked   Other Topics Concern  . None   Social History Narrative  . None    Review of Systems: A 12 point ROS discussed and pertinent positives are indicated in the HPI above.  All other systems are negative.  Review of Systems  Vital Signs: BP (!) 118/57 (BP Location: Left Arm)   Pulse 62   Temp 98.7 F (37.1 C)   Resp 18   Ht 5\' 11"  (1.803 m)   Wt 161 lb 6 oz (73.2 kg)   SpO2 100%   BMI 22.51 kg/m   Physical Exam  Constitutional: He is oriented to person, place, and time. He appears well-developed. No distress.  HENT:  Head: Normocephalic.  Mouth/Throat: Oropharynx is clear and moist.  Neck: Normal range of motion. No JVD present. No tracheal deviation present.  Cardiovascular: Normal rate and regular rhythm.  Exam reveals no friction rub.   Murmur heard. Pulmonary/Chest: Effort normal and breath sounds normal. No respiratory distress.  Abdominal: Soft. He exhibits no distension.  Mild RUQ tenderness  Neurological: He is alert and oriented to person, place, and time.  Skin: Skin is warm and dry.  Psychiatric: He has a normal mood and affect. Judgment normal.    Mallampati Score:  MD Evaluation Airway: WNL Heart: WNL Abdomen: WNL Chest/ Lungs: WNL ASA  Classification: 3 Mallampati/Airway Score: Two  Imaging: Nm Hepatobiliary Including Gb  Result Date: 08/07/2016 CLINICAL DATA:  Right upper quadrant pain. Epigastric and right upper quadrant pain since Wednesday. Nausea. No vomiting.  Leukocytosis. White count 28 22,000. Ultrasound shows sludge and stones. EXAM: NUCLEAR MEDICINE HEPATOBILIARY IMAGING TECHNIQUE: Sequential images of the abdomen were obtained out to 60 minutes following intravenous administration of radiopharmaceutical. RADIOPHARMACEUTICALS:  5.44 mCi Tc-40m  Choletec IV COMPARISON:  Ultrasound of the abdomen 08/06/2016 FINDINGS: Prompt uptake and biliary excretion of activity by the liver is seen. Activity is identified within the bowel loops as early as 20 minutes. However there is no activity within the gallbladder at 60 minutes. 2.9 mg of morphine was administered and subsequent imaging to 30 minutes demonstrates no gallbladder activity. IMPRESSION: Findings are consistent with acute cholecystitis. Electronically Signed   By: Norva Pavlov M.D.   On: 08/07/2016 16:25    Labs:  CBC:  Recent Labs  08/07/16 0656  WBC 17.8*  HGB 13.7  HCT 37.5*  PLT 151    COAGS:  Recent Labs  08/07/16 0656  INR 1.36    BMP:  Recent Labs  08/07/16 0656  NA 140  K 4.1  CL 106  CO2 26  GLUCOSE 115*  BUN 21*  CALCIUM 8.2*  CREATININE 1.33*  GFRNONAA 50*  GFRAA 58*    LIVER FUNCTION TESTS:  Recent Labs  08/07/16 0656  BILITOT 3.0*  AST 82*  ALT 106*  ALKPHOS 133*  PROT 6.2*  ALBUMIN 3.2*    TUMOR MARKERS: No results for input(s): AFPTM, CEA, CA199, CHROMGRNA in the last 8760 hours.  Assessment and Plan: Cholecystitis Plan for perc chole drain Labs reviewed. Risks and Benefits discussed with the patient including, but not limited to bleeding, infection, gallbladder perforation, bile leak, sepsis or even death. All of the patient's questions were answered, patient is agreeable to proceed. Consent signed and in chart.    Thank you for this interesting consult.  I greatly enjoyed meeting Unk PintoJoseph Mcenery and look forward to participating in their care.  A copy of this report was sent to the requesting provider on this date.  Electronically  Signed: Brayton ElBRUNING, Aamilah Augenstein, PA-C 08/08/2016, 8:45 AM   I spent a total of 20 minutes in face to face in clinical consultation, greater than 50% of which was counseling/coordinating care for perc chole drain

## 2016-08-08 NOTE — Progress Notes (Signed)
Pharmacy Antibiotic Note  Benjamin PintoJoseph Thornton is a 76 y.o. male admitted on 08/06/2016 with intra-abd infection.  Pharmacy has been consulted for Zosyn dosing. He is now s/p perc chole drain placement by radiology.  Plan: Zosyn 3.375g IV q8h EI Will f/u micro data, renal function, and pt's clinical condition  Height: 5\' 11"  (180.3 cm) Weight: 161 lb 6 oz (73.2 kg) IBW/kg (Calculated) : 75.3  Temp (24hrs), Avg:98.6 F (37 C), Min:98.1 F (36.7 C), Max:99.1 F (37.3 C)   Recent Labs Lab 08/07/16 0656 08/08/16 0758  WBC 17.8* 14.1*  CREATININE 1.33* 1.10    Estimated Creatinine Clearance: 58.2 mL/min (by C-G formula based on SCr of 1.1 mg/dL).    Not on File  Antimicrobials this admission: 6/16 Zosyn >>   Microbiology results: 6/17 bile:   Thank you for allowing pharmacy to be a part of this patient's care. Reilyn Nelson D. Laverda Stribling, PharmD, BCPS Clinical Pharmacist Pager: [phone] Clinical Phone for 08/08/2016 until 3:30pm: [phone] If after 3:30pm, please call main pharmacy at [phone] 08/08/2016 1:30 PM

## 2016-08-08 NOTE — Progress Notes (Signed)
PROGRESS NOTE    Unk PintoJoseph Island  ZOX:[id number] DOB: 1940/11/24 DOA: 08/06/2016 PCP: Rudi Heaposgrove, [name], MD  Brief Narrative:Benjamin Thornton is a 76 y.o. 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 RUQ ultrasound showed stones and sludge but no pericholecystic fluid, no wall thickening, and no sonographic Murphy's sign.     Assessment & Plan: 1. Cholelithiasis with Acute cholecystitis -Appreciate surgical consult -Surgery recommended percutaneous drainage if cystic duct obstructed, not felt to be a surgical candidate because of severe aortic stenosis -IR consulted for Perc GB drain today  2. Severe aortic stenosis   -[place] -Managed conservatively due to her overall poor prognosis with debility and multiple medical problems -Currently asymptomatic -No evidence of CHF -History of PPM  -On digoxin at home  3. HTN --Amlodipine, toprol  4. DM - continue-Half dose of levemir  SSI, CBGs stable  5. 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  DVT prophylaxis: Lovenox Code Status: DNR Family Communication: Son and caregiver (ex-wife) present at bedside  Dispo: pending stability and above Rx  Consultants:   ccs  Antimicrobials:   Zosyn   Subjective: Denies much pain now, no N/V  Objective: Vitals:   08/08/16 1040 08/08/16 1045 08/08/16 1050 08/08/16 1055  BP: 130/62 127/61 139/61 135/61  Pulse: 60 61 (!) 59 60  Resp: 13 11 10 10   Temp:      TempSrc:      SpO2: 100% 99% 99% 100%  Weight:      Height:        Intake/Output Summary (Last 24 hours) at 08/08/16 1152 Last data filed at 08/08/16 1151  Gross per 24 hour  Intake          2060.66  ml  Output                0 ml  Net          2060.66 ml   Filed Weights   08/07/16 0501  Weight: 73.2 kg (161 lb 6 oz)    Examination:  Gen: Awake, Alert, Oriented X 3,  HEENT: PERRLA, Neck supple, no JVD Lungs: Good air movement bilaterally, CTAB CVS: RRR, grade 4 systolic murmur Abd: soft, tender RUQ, non distended, BS present Extremities: No Cyanosis, Clubbing or edema Skin: no new rashes     Data Reviewed:   CBC:  Recent Labs Lab 08/07/16 0656 08/08/16 0758  WBC 17.8* 14.1*  NEUTROABS 13.6*  --   HGB 13.7 13.0  HCT 37.5* 35.7*  MCV 85.4 85.0  PLT 151 155   Basic Metabolic Panel:  Recent Labs Lab 08/07/16 0656 08/08/16 0758  NA 140 139  K 4.1 4.3  CL 106 107  CO2 26 25  GLUCOSE 115* 86  BUN 21* 15  CREATININE 1.33* 1.10  CALCIUM 8.2* 8.2*   GFR: Estimated Creatinine Clearance: 58.2 mL/min (by C-G formula based on SCr of 1.1 mg/dL). Liver Function Tests:  Recent Labs Lab 08/07/16 0656 08/08/16 0758  AST 82* 28  ALT 106* 60  ALKPHOS 133* 101  BILITOT 3.0* 2.1*  PROT 6.2* 5.9*  ALBUMIN 3.2* 2.9*   No results for input(s): LIPASE, AMYLASE in the last 168 hours. No results for input(s): AMMONIA in the last 168 hours. Coagulation  Profile:  Recent Labs Lab 08/07/16 0656  INR 1.36   Cardiac Enzymes: No results for input(s): CKTOTAL, CKMB, CKMBINDEX, TROPONINI in the last 168 hours. BNP (last 3 results) No results for input(s): PROBNP in the last 8760 hours. HbA1C: No results for input(s): HGBA1C in the last 72 hours. CBG:  Recent Labs Lab 08/07/16 0833 08/07/16 1400 08/07/16 1648 08/07/16 2235 08/08/16 0810  GLUCAP 109* 85 138* 138* 90   Lipid Profile: No results for input(s): CHOL, HDL, LDLCALC, TRIG, CHOLHDL, LDLDIRECT in the last 72 hours. Thyroid Function Tests: No results for input(s): TSH, T4TOTAL, FREET4, T3FREE, THYROIDAB in the last 72 hours. Anemia Panel: No results for input(s): VITAMINB12, FOLATE, FERRITIN, TIBC,  IRON, RETICCTPCT in the last 72 hours. Urine analysis: No results found for: COLORURINE, APPEARANCEUR, LABSPEC, PHURINE, GLUCOSEU, HGBUR, BILIRUBINUR, KETONESUR, PROTEINUR, UROBILINOGEN, NITRITE, LEUKOCYTESUR Sepsis Labs: @LABRCNTIP (procalcitonin:4,lacticidven:4)  )No results found for this or any previous visit (from the past 240 hour(s)).       Radiology Studies: Nm Hepatobiliary Including Gb  Result Date: 08/07/2016 CLINICAL DATA:  Right upper quadrant pain. Epigastric and right upper quadrant pain since Wednesday. Nausea. No vomiting. Leukocytosis. White count 28 22,000. Ultrasound shows sludge and stones. EXAM: NUCLEAR MEDICINE HEPATOBILIARY IMAGING TECHNIQUE: Sequential images of the abdomen were obtained out to 60 minutes following intravenous administration of radiopharmaceutical. RADIOPHARMACEUTICALS:  5.44 mCi Tc-16m  Choletec IV COMPARISON:  Ultrasound of the abdomen 08/06/2016 FINDINGS: Prompt uptake and biliary excretion of activity by the liver is seen. Activity is identified within the bowel loops as early as 20 minutes. However there is no activity within the gallbladder at 60 minutes. 2.9 mg of morphine was administered and subsequent imaging to 30 minutes demonstrates no gallbladder activity. IMPRESSION: Findings are consistent with acute cholecystitis. Electronically Signed   By: Norva Pavlov M.D.   On: 08/07/2016 16:25        Scheduled Meds: . lidocaine (PF)      . amLODipine  10 mg Oral Daily  . digoxin  0.25 mg Oral Daily  . feeding supplement (ENSURE ENLIVE)  237 mL Oral BID BM  . insulin aspart  0-15 Units Subcutaneous TID WC  . insulin detemir  8 Units Subcutaneous QHS  . latanoprost  1 drop Both Eyes QHS  . levothyroxine  50 mcg Oral QAC breakfast  . sodium chloride flush  3 mL Intravenous Q12H  . sodium chloride flush  3 mL Intravenous Q12H   Continuous Infusions: . sodium chloride 250 mL (08/07/16 0000)  . dextrose 5 % and 0.45% NaCl 1,000 mL  (08/08/16 1151)  . piperacillin-tazobactam (ZOSYN)  IV Stopped (08/08/16 0942)     LOS: 2 days    Time spent:    Zannie Cove, MD [facility] Pager [phone]  If 7PM-7AM, please contact night-coverage [email or web address] Password [facility] 08/08/2016, 11:52 AM

## 2016-08-09 LAB — GLUCOSE, CAPILLARY
Glucose-Capillary: 116 mg/dL — ABNORMAL HIGH (ref 65–99)
Glucose-Capillary: 161 mg/dL — ABNORMAL HIGH (ref 65–99)
Glucose-Capillary: 145 mg/dL — ABNORMAL HIGH (ref 65–99)
Glucose-Capillary: 198 mg/dL — ABNORMAL HIGH (ref 65–99)

## 2016-08-09 MED ORDER — AMOXICILLIN-POT CLAVULANATE 875-125 MG PO TABS
1.0000 | ORAL_TABLET | Freq: Two times a day (BID) | ORAL | Status: DC
  Administered 2016-08-09 – 2016-08-10 (×3): 1 via ORAL
  Filled 2016-08-09 (×3): qty 1

## 2016-08-09 NOTE — Discharge Instructions (Signed)
Flush drain with 5cc of normal saline daily and record output daily and bring it with you to your follow up visit.

## 2016-08-09 NOTE — Progress Notes (Signed)
PROGRESS NOTE    Benjamin Thornton  RUE:[id number] DOB: 1940/08/02 DOA: 08/06/2016 PCP: Rudi Heaposgrove, [name], MD  Brief Narrative:Benjamin Thornton is a 76 y.o. 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 RUQ ultrasound showed stones and sludge but no pericholecystic fluid, no wall thickening, and no sonographic Murphy's sign.     Assessment & Plan: 1. Cholelithiasis with Acute cholecystitis -Appreciate surgical consult -Surgery recommended percutaneous drainage if cystic duct obstructed, not felt to be a surgical candidate because of severe aortic stenosis -IR consulted, s/p Perc GB drain 6/17  2. Severe aortic stenosis   -[place] -Managed conservatively due to her overall poor prognosis with debility and multiple medical problems -Currently asymptomatic, no evidence of CHF -History of PPM  -On digoxin at home  3. HTN --Amlodipine, toprol  4. DM - continue-Half dose of levemir  SSI, CBGs stable  5. 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  DVT prophylaxis: Lovenox Code Status: DNR Family Communication: Son and caregiver (ex-wife) present at bedside  Dispo:  Home tomorrow  Consultants:   ccs  Antimicrobials:   Zosyn   Subjective: Denies much pain now, no N/V  Objective: Vitals:   08/08/16 1730 08/08/16 2151 08/09/16 0547 08/09/16 0958  BP: (!) 149/59 (!) 134/53 (!) 130/51   Pulse: 70 73 60 81  Resp: 18 18 18    Temp: 97.7 F (36.5 C) 99.7 F (37.6 C) 98.5 F (36.9 C)   TempSrc:      SpO2:  94% 98%   Weight:      Height:        Intake/Output Summary (Last 24 hours) at 08/09/16 1439 Last data filed at 08/09/16 0414  Gross per 24 hour    Intake           718.33 ml  Output              450 ml  Net           268.33 ml   Filed Weights   08/07/16 0501  Weight: 73.2 kg (161 lb 6 oz)    Examination:  Gen: Awake, Alert, Oriented X 3,  HEENT: PERRLA, Neck supple, no JVD Lungs: Good air movement bilaterally, CTAB CVS: RRR, grade 4 systolic murmur Abd: soft, mild RUQ tenderness, non distended, BS present, GB drain noted Extremities: No Cyanosis, Clubbing or edema Skin: no new rashes     Data Reviewed:   CBC:  Recent Labs Lab 08/07/16 0656 08/08/16 0758  WBC 17.8* 14.1*  NEUTROABS 13.6*  --   HGB 13.7 13.0  HCT 37.5* 35.7*  MCV 85.4 85.0  PLT 151 155   Basic Metabolic Panel:  Recent Labs Lab 08/07/16 0656 08/08/16 0758  NA 140 139  K 4.1 4.3  CL 106 107  CO2 26 25  GLUCOSE 115* 86  BUN 21* 15  CREATININE 1.33* 1.10  CALCIUM 8.2* 8.2*   GFR: Estimated Creatinine Clearance: 58.2 mL/min (by C-G formula based on SCr of 1.1 mg/dL). Liver Function Tests:  Recent Labs Lab 08/07/16 0656 08/08/16 0758  AST 82* 28  ALT 106* 60  ALKPHOS 133* 101  BILITOT 3.0* 2.1*  PROT 6.2* 5.9*  ALBUMIN 3.2* 2.9*   No results for input(s): LIPASE, AMYLASE in the  last 168 hours. No results for input(s): AMMONIA in the last 168 hours. Coagulation Profile:  Recent Labs Lab 08/07/16 0656  INR 1.36   Cardiac Enzymes: No results for input(s): CKTOTAL, CKMB, CKMBINDEX, TROPONINI in the last 168 hours. BNP (last 3 results) No results for input(s): PROBNP in the last 8760 hours. HbA1C: No results for input(s): HGBA1C in the last 72 hours. CBG:  Recent Labs Lab 08/08/16 1253 08/08/16 1746 08/08/16 2149 08/09/16 0806 08/09/16 1229  GLUCAP 115* 151* 182* 116* 161*   Lipid Profile: No results for input(s): CHOL, HDL, LDLCALC, TRIG, CHOLHDL, LDLDIRECT in the last 72 hours. Thyroid Function Tests: No results for input(s): TSH, T4TOTAL, FREET4, T3FREE, THYROIDAB in the last 72 hours. Anemia Panel: No  results for input(s): VITAMINB12, FOLATE, FERRITIN, TIBC, IRON, RETICCTPCT in the last 72 hours. Urine analysis: No results found for: COLORURINE, APPEARANCEUR, LABSPEC, PHURINE, GLUCOSEU, HGBUR, BILIRUBINUR, KETONESUR, PROTEINUR, UROBILINOGEN, NITRITE, LEUKOCYTESUR Sepsis Labs: @LABRCNTIP (procalcitonin:4,lacticidven:4)  ) Recent Results (from the past 240 hour(s))  Culture, body fluid-bottle     Status: None (Preliminary result)   Collection Time: 08/08/16 11:00 AM  Result Value Ref Range Status   Specimen Description BILE  Final   Special Requests NONE  Final   Gram Stain   Final    GRAM NEGATIVE RODS IN BOTH AEROBIC AND ANAEROBIC BOTTLES CONSISTENT WITH PREVIOUS RESULT    Culture   Final    GRAM NEGATIVE RODS IDENTIFICATION AND SUSCEPTIBILITIES TO FOLLOW    Report Status PENDING  Incomplete  Gram stain     Status: None   Collection Time: 08/08/16 11:00 AM  Result Value Ref Range Status   Specimen Description BILE  Final   Special Requests NONE  Final   Gram Stain   Final    RARE WBC PRESENT,BOTH PMN AND MONONUCLEAR MODERATE GRAM NEGATIVE RODS    Report Status 08/08/2016 FINAL  Final         Radiology Studies: No results found.      Scheduled Meds: . amLODipine  10 mg Oral Daily  . digoxin  0.25 mg Oral Daily  . feeding supplement (ENSURE ENLIVE)  237 mL Oral BID BM  . insulin aspart  0-15 Units Subcutaneous TID WC  . insulin detemir  8 Units Subcutaneous QHS  . latanoprost  1 drop Both Eyes QHS  . levothyroxine  50 mcg Oral QAC breakfast  . sodium chloride flush  3 mL Intravenous Q12H  . sodium chloride flush  3 mL Intravenous Q12H   Continuous Infusions: . sodium chloride 250 mL (08/07/16 0000)  . piperacillin-tazobactam (ZOSYN)  IV 3.375 g (08/09/16 1342)     LOS: 3 days    Time spent:    Zannie Cove, MD [facility] Pager [phone]  If 7PM-7AM, please contact night-coverage [email or web address] Password [facility] 08/09/2016, 2:39  PM

## 2016-08-09 NOTE — Progress Notes (Signed)
Patient ID: Benjamin Thornton, male   DOB: 1940-07-20, 76 y.o.   MRN: [id number]    Referring Physician(s): Dr. Jimmye NormanJames Wyatt  Supervising Physician: Jolaine ClickHoss, Arthur  [facility]  Chief Complaint: Acute cholecystitis  Subjective: Patient feels better today.  Eating regular diet.  Allergies: Patient has no allergy information on record.  Medications: Prior to Admission medications   Medication Sig Start Date End Date Taking? Authorizing Provider  aspirin EC 81 MG tablet Take 81 mg by mouth daily.   Yes [provider]  digoxin (LANOXIN) 0.125 MG tablet Take 0.0625-0.125 mg by mouth daily. Pt takes 0.0625 mg on Tuesday and Thursday; Pt takes 0.125 mg on Monday, Wednesday, Friday, Saturday and Sunday   Yes [provider]  diphenhydramine-acetaminophen (TYLENOL PM) 25-500 MG TABS tablet Take 1 tablet by mouth at bedtime.   Yes [provider]  gabapentin (NEURONTIN) 100 MG capsule Take 100 mg by mouth daily.   Yes [provider]  insulin detemir (LEVEMIR) 100 UNIT/ML injection Inject 8 Units into the skin at bedtime.   Yes [provider]  levothyroxine (SYNTHROID, LEVOTHROID) 50 MCG tablet Take 50 mcg by mouth daily before breakfast.   Yes [provider]  metoprolol succinate (TOPROL-XL) 50 MG 24 hr tablet Take 25 mg by mouth daily. Take with or immediately following a meal.   Yes [provider]  naproxen sodium (ANAPROX) 220 MG tablet Take 220 mg by mouth daily.   Yes [provider]  omeprazole (PRILOSEC) 20 MG capsule Take 20 mg by mouth every morning.   Yes [provider]  travoprost, benzalkonium, (TRAVATAN) 0.004 % ophthalmic solution Place 1 drop into both eyes at bedtime.   Yes [provider]    Vital Signs: BP (!) 130/51 (BP Location: Left Arm)   Pulse 81   Temp 98.5 F (36.9 C)   Resp 18   Ht 5\' 11"  (1.803 m)   Wt 161 lb 6 oz (73.2 kg)   SpO2 98%   BMI 22.51 kg/m    Physical Exam: Abd: soft, less tender, drain draining bilious output well with no issues.  Drain site is c/d/i. 200cc noted as output yesterday  Imaging: Nm Hepatobiliary Including Gb  Result Date: 08/07/2016 CLINICAL DATA:  Right upper quadrant pain. Epigastric and right upper quadrant pain since Wednesday. Nausea. No vomiting. Leukocytosis. White count 28 22,000. Ultrasound shows sludge and stones. EXAM: NUCLEAR MEDICINE HEPATOBILIARY IMAGING TECHNIQUE: Sequential images of the abdomen were obtained out to 60 minutes following intravenous administration of radiopharmaceutical. RADIOPHARMACEUTICALS:  5.44 mCi Tc-1551m  Choletec IV COMPARISON:  Ultrasound of the abdomen 08/06/2016 FINDINGS: Prompt uptake and biliary excretion of activity by the liver is seen. Activity is identified within the bowel loops as early as 20 minutes. However there is no activity within the gallbladder at 60 minutes. 2.9 mg of morphine was administered and subsequent imaging to 30 minutes demonstrates no gallbladder activity. IMPRESSION: Findings are consistent with acute cholecystitis. Electronically Signed   By: Norva PavlovElizabeth  Brown M.D.   On: 08/07/2016 16:25    Labs:  CBC:  Recent Labs  08/07/16 0656 08/08/16 0758  WBC 17.8* 14.1*  HGB 13.7 13.0  HCT 37.5* 35.7*  PLT 151 155    COAGS:  Recent Labs  08/07/16 0656  INR 1.36    BMP:  Recent Labs  08/07/16 0656 08/08/16 0758  NA 140 139  K 4.1 4.3  CL 106 107  CO2 26 25  GLUCOSE 115*  86  BUN 21* 15  CALCIUM 8.2* 8.2*  CREATININE 1.33* 1.10  GFRNONAA 50* >60  GFRAA 58* >60    LIVER FUNCTION TESTS:  Recent Labs  08/07/16 0656 08/08/16 0758  BILITOT 3.0* 2.1*  AST 82* 28  ALT 106* 60  ALKPHOS 133* 101  PROT 6.2* 5.9*  ALBUMIN 3.2* 2.9*    Assessment and Plan: 1. Acute cholecystitis, s/p perc chole drain placement on 6/17  We will arrange for output drain clinic follow up for the patient for a drain injection around 5 weeks from  placement. Our office will contact him for this appointment He will need to flush the drain daily with 5cc of normal saline and document output and bring it with him to his follow up appointment.  Electronically Signed: Letha Cape 08/09/2016, 1:05 PM   I spent a total of 15 Minutes at the the patient's bedside AND on the patient's hospital floor or unit, greater than 50% of which was counseling/coordinating care for acute cholecystitis

## 2016-08-09 NOTE — Progress Notes (Signed)
Patient ID: [name], male   DOB: Jan 29, 1941, 76 y.o.   MRN: [id number]  [facility] Progress Note     Subjective: CC- s/p perc chole 6/17 Main complaint today is taste of hospital food. States that he feels hungry but nothing tastes good. Sore around drain site, but otherwise no abdominal pain. Denies n/v.  Hoping to go home tomorrow.  Objective: Vital signs in last 24 hours: Temp:  [97.7 F (36.5 C)-99.7 F (37.6 C)] 98.5 F (36.9 C) (06/18 0547) Pulse Rate:  [59-81] 81 (06/18 0958) Resp:  [10-18] 18 (06/18 0547) BP: (127-149)/(51-74) 130/51 (06/18 0547) SpO2:  [94 %-100 %] 98 % (06/18 0547) Last BM Date: 08/06/16  Intake/Output from previous day: 06/17 0701 - 06/18 0700 In: 1300.8 [P.O.:320; I.V.:880.8; IV Piggyback:100] Out: 450 [Urine:250; Drains:200] Intake/Output this shift: No intake/output data recorded.  PE: Gen:  Alert, NAD, pleasant HEENT: EOM's intact, pupils equal  Card:  Regular rate, +murmur Pulm:  Diffuse rhonchi, no wheezes Abd: Soft, ND, +BS, drain with bilious fluid in bag, tender around drain Ext:  No erythema, edema, or tenderness BUE/BLE  Psych: A&Ox3   Lab Results:   Recent Labs  08/07/16 0656 08/08/16 0758  WBC 17.8* 14.1*  HGB 13.7 13.0  HCT 37.5* 35.7*  PLT 151 155   BMET  Recent Labs  08/07/16 0656 08/08/16 0758  NA 140 139  K 4.1 4.3  CL 106 107  CO2 26 25  GLUCOSE 115* 86  BUN 21* 15  CREATININE 1.33* 1.10  CALCIUM 8.2* 8.2*   PT/INR  Recent Labs  08/07/16 0656  LABPROT 16.9*  INR 1.36   CMP     Component Value Date/Time   NA 139 08/08/2016 0758   K 4.3 08/08/2016 0758   CL 107 08/08/2016 0758   CO2 25 08/08/2016 0758   GLUCOSE 86 08/08/2016 0758   BUN 15 08/08/2016 0758   CREATININE 1.10 08/08/2016 0758   CALCIUM 8.2 (L) 08/08/2016 0758   PROT 5.9 (L) 08/08/2016 0758   ALBUMIN 2.9 (L) 08/08/2016 0758   AST 28 08/08/2016 0758   ALT 60 08/08/2016 0758   ALKPHOS 101 08/08/2016 0758   BILITOT 2.1 (H) 08/08/2016 0758   GFRNONAA >60 08/08/2016 0758   GFRAA >60 08/08/2016 0758   Lipase  No results found for: LIPASE     Studies/Results: Nm Hepatobiliary Including Gb  Result Date: 08/07/2016 CLINICAL DATA:  Right upper quadrant pain. Epigastric and right upper quadrant pain since Wednesday. Nausea. No vomiting. Leukocytosis. White count 28 22,000. Ultrasound shows sludge and stones. EXAM: NUCLEAR MEDICINE HEPATOBILIARY IMAGING TECHNIQUE: Sequential images of the abdomen were obtained out to 60 minutes following intravenous administration of radiopharmaceutical. RADIOPHARMACEUTICALS:  5.44 mCi Tc-8632m  Choletec IV COMPARISON:  Ultrasound of the abdomen 08/06/2016 FINDINGS: Prompt uptake and biliary excretion of activity by the liver is seen. Activity is identified within the bowel loops as early as 20 minutes. However there is no activity within the gallbladder at 60 minutes. 2.9 mg of morphine was administered and subsequent imaging to 30 minutes demonstrates no gallbladder activity. IMPRESSION: Findings are consistent with acute cholecystitis. Electronically Signed   By: Norva PavlovElizabeth  Brown M.D.   On: 08/07/2016 16:25    Anti-infectives: Anti-infectives    Start     Dose/Rate Route Frequency Ordered Stop   08/07/16 0800  piperacillin-tazobactam (ZOSYN) IVPB 3.375 g     3.375 g 12.5 mL/hr over 240 Minutes Intravenous Every 8 hours 08/06/16 2345  08/07/16 0000  piperacillin-tazobactam (ZOSYN) IVPB 3.375 g     3.375 g 100 mL/hr over 30 Minutes Intravenous STAT 08/06/16 2345 08/07/16 0043       Assessment/Plan Severe aortic stenosis >> poor surgical candidate HTN DM Hypothyroidism Chronic cerebellar ataxia  Acute cholecystitis - HIDA 6/16 positive  - s/p perc chole 6/17, culture pending  ID - zosyn 6/16>> FEN - regular diet  VTE - SCDs, ok to restart lovenox from our standpoint  Plan - continue drain and antibiotics.  Patient will need follow-up in drain clinic  at discharge, and then with Dr. Lindie Spruce.   LOS: 3 days    Edson Snowball , [facility] 08/09/2016, 10:17 AM Pager: [phone] Consults: [phone] Mon-Fri 7:00 am-4:30 pm Sat-Sun 7:00 am-11:30 am

## 2016-08-09 NOTE — Care Management Note (Signed)
Case Management Note  Patient Details  Name: Benjamin PintoJoseph Thornton MRN: [id number] Date of Birth: September 09, 1940  Subjective/Objective:   Cholelithiasis with acute cholecystitis, severe aortic stenosis, HTN                 Action/Plan: Discharge Planning: NCM spoke to pt and ex-wife, Benjamin BlueDeborah Thornton # [phone] at bedside. Wife [facility], contact [place] LPN #[phone] ext [phone] fax [phone]. [place]. [facility] [phone] fax [phone]. Faxed HH orders to [facility], will fax dc summary when it becomes available.   [facility]. Will follow up tomorrow on possible delivery date.    Expected Discharge Date:                  Expected Discharge Plan:  Home w Home Health Services  In-House Referral:  NA  Discharge planning Services  CM Consult  Post Acute Care Choice:  Home Health Choice offered to:  Spouse, Patient  DME Arranged:  Hospital bed DME Agency:  Other - Comment  HH Arranged:  RN HH Agency:  [facility]  Status of Service:  In process, will continue to follow  If discussed at Long Length of Stay Meetings, dates discussed:    Additional Comments:  Elliot CousinShavis, Jearldean Gutt Ellen, RN 08/09/2016, 6:40 PM

## 2016-08-10 ENCOUNTER — Encounter (HOSPITAL_COMMUNITY): Payer: Self-pay | Admitting: Interventional Radiology

## 2016-08-10 ENCOUNTER — Other Ambulatory Visit: Payer: Self-pay | Admitting: General Surgery

## 2016-08-10 DIAGNOSIS — K810 Acute cholecystitis: Secondary | ICD-10-CM

## 2016-08-10 LAB — COMPREHENSIVE METABOLIC PANEL
ALT: 29 U/L (ref 17–63)
AST: 16 U/L (ref 15–41)
Albumin: 3 g/dL — ABNORMAL LOW (ref 3.5–5.0)
Alkaline Phosphatase: 83 U/L (ref 38–126)
Anion gap: 10 (ref 5–15)
BILIRUBIN TOTAL: 1.1 mg/dL (ref 0.3–1.2)
BUN: 8 mg/dL (ref 6–20)
CHLORIDE: 102 mmol/L (ref 101–111)
CO2: 25 mmol/L (ref 22–32)
CREATININE: 0.82 mg/dL (ref 0.61–1.24)
Calcium: 8.9 mg/dL (ref 8.9–10.3)
GFR calc Af Amer: >60 mL/min (ref >60)
Glucose, Bld: 131 mg/dL — ABNORMAL HIGH (ref 65–99)
Potassium: 4.3 mmol/L (ref 3.5–5.1)
Sodium: 137 mmol/L (ref 135–145)
Total Protein: 6.5 g/dL (ref 6.5–8.1)

## 2016-08-10 LAB — CBC
HEMATOCRIT: 37.9 % — AB (ref 39.0–52.0)
Hemoglobin: 14 g/dL (ref 13.0–17.0)
MCH: 31.5 pg (ref 26.0–34.0)
MCHC: 36.9 g/dL — ABNORMAL HIGH (ref 30.0–36.0)
MCV: 85.2 fL (ref 78.0–100.0)
Platelets: 187 10*3/uL (ref 150–400)
RBC: 4.45 MIL/uL (ref 4.22–5.81)
RDW: 13.9 % (ref 11.5–15.5)
WBC: 18 10*3/uL — ABNORMAL HIGH (ref 4.0–10.5)

## 2016-08-10 LAB — GLUCOSE, CAPILLARY
GLUCOSE-CAPILLARY: 136 mg/dL — AB (ref 65–99)
Glucose-Capillary: 136 mg/dL — ABNORMAL HIGH (ref 65–99)

## 2016-08-10 MED ORDER — POLYETHYLENE GLYCOL 3350 17 G PO PACK
17.0000 g | PACK | Freq: Every day | ORAL | Status: DC
  Filled 2016-08-10: qty 1

## 2016-08-10 MED ORDER — METRONIDAZOLE 250 MG PO TABS: 250.0000 mg | ORAL_TABLET | Freq: Three times a day (TID) | ORAL | 0 refills | Status: AC

## 2016-08-10 MED ORDER — CEPHALEXIN 250 MG PO CAPS: 250.0000 mg | ORAL_CAPSULE | Freq: Four times a day (QID) | ORAL | 0 refills | Status: AC

## 2016-08-10 MED ORDER — POLYETHYLENE GLYCOL 3350 17 G PO PACK: 17.0000 g | PACK | Freq: Every day | ORAL | 0 refills | Status: AC | PRN

## 2016-08-10 MED ORDER — AMOXICILLIN-POT CLAVULANATE 875-125 MG PO TABS: 1.0000 | ORAL_TABLET | Freq: Two times a day (BID) | ORAL | 0 refills | Status: DC

## 2016-08-10 MED ORDER — HYDROCODONE-ACETAMINOPHEN 5-325 MG PO TABS: 1.0000 | ORAL_TABLET | Freq: Four times a day (QID) | ORAL | 0 refills | Status: AC | PRN

## 2016-08-10 MED ORDER — DOCUSATE SODIUM 100 MG PO CAPS
100.0000 mg | ORAL_CAPSULE | Freq: Every day | ORAL | Status: DC
  Filled 2016-08-10: qty 1

## 2016-08-10 NOTE — Care Management Note (Addendum)
Case Management Note  Patient Details  Name: Benjamin Thornton MRN: [id number] Date of Birth: 03-12-1940  Subjective/Objective:                    Action/Plan: NCM called and left voice message seeking hospital bed's authorization / delivery date  [name] [place]. LPN #[phone], awaiting call back.  Pt requesting PCP in [place]. NCM scheduled hospital post follow up / PCP on 09/10/2016 at 1:00 pm with Dr. Lorenda Ishiharaupashree Varadarajan, [facility] Patsi Searsannenbaum. NCM made pt and ex- wife aware.  Expected Discharge Date:  08/10/16               Expected Discharge Plan:  Home w Home Health Services  In-House Referral:  NA  Discharge planning Services  CM Consult  Post Acute Care Choice:  Home Health Choice offered to:  Spouse, Patient  DME Arranged:  Hospital bed DME Agency:    HH Arranged:  [facility]  [facility]  Status of Service:  Completed, signed off  If discussed at Long Length of Stay Meetings, dates discussed:    Additional Comments:  Epifanio LeschesCole, Darl Kuss Hudson, RN 08/10/2016, 12:01 PM

## 2016-08-10 NOTE — Progress Notes (Signed)
Referring Physician(s):  Dr. Jimmye NormanJames Wyatt  Supervising Physician: Jolaine ClickHoss, Arthur  [facility]  Chief Complaint: Acute cholecystitis  Subjective: Patient resting in bed.  Wife at bedside.  Has questions about home health and discharge plans.   Allergies: Patient has no allergy information on record.  Medications: Prior to Admission medications   Medication Sig Start Date End Date Taking? Authorizing Provider  aspirin EC 81 MG tablet Take 81 mg by mouth daily.   Yes [provider]  digoxin (LANOXIN) 0.125 MG tablet Take 0.0625-0.125 mg by mouth daily. Pt takes 0.0625 mg on Tuesday and Thursday; Pt takes 0.125 mg on Monday, Wednesday, Friday, Saturday and Sunday   Yes [provider]  diphenhydramine-acetaminophen (TYLENOL PM) 25-500 MG TABS tablet Take 1 tablet by mouth at bedtime.   Yes [provider]  gabapentin (NEURONTIN) 100 MG capsule Take 100 mg by mouth daily.   Yes [provider]  insulin detemir (LEVEMIR) 100 UNIT/ML injection Inject 8 Units into the skin at bedtime.   Yes [provider]  levothyroxine (SYNTHROID, LEVOTHROID) 50 MCG tablet Take 50 mcg by mouth daily before breakfast.   Yes [provider]  metoprolol succinate (TOPROL-XL) 50 MG 24 hr tablet Take 25 mg by mouth daily. Take with or immediately following a meal.   Yes [provider]  naproxen sodium (ANAPROX) 220 MG tablet Take 220 mg by mouth daily.   Yes [provider]  omeprazole (PRILOSEC) 20 MG capsule Take 20 mg by mouth every morning.   Yes [provider]  travoprost, benzalkonium, (TRAVATAN) 0.004 % ophthalmic solution Place 1 drop into both eyes at bedtime.   Yes [provider]  cephALEXin (KEFLEX) 250 MG capsule Take 1 capsule (250 mg total) by mouth 4 (four) times daily. For 5days 08/10/16   Zannie CoveJoseph, Preetha, MD  HYDROcodone-acetaminophen (NORCO/VICODIN) 5-325 MG tablet Take 1-2 tablets by  mouth every 6 (six) hours as needed for moderate pain. 08/10/16   Zannie CoveJoseph, Preetha, MD  metroNIDAZOLE (FLAGYL) 250 MG tablet Take 1 tablet (250 mg total) by mouth 3 (three) times daily. For 5days 08/10/16   Zannie CoveJoseph, Preetha, MD  polyethylene glycol [facility](MIRALAX / Ethelene HalGLYCOLAX) packet Take 17 g by mouth daily as needed. 08/10/16   Zannie CoveJoseph, Preetha, MD     Vital Signs: BP (!) 138/56   Pulse 66   Temp 97.9 F (36.6 C)   Resp 18   Ht 5\' 11"  (1.803 m)   Wt 161 lb 6 oz (73.2 kg)   SpO2 97%   BMI 22.51 kg/m   Physical Exam  Alert, awake, NAD Abd:  Drain in place in RUQ.  Serous drainage in tube, thin bilious ouput in bag. Insertion site clean and dry.   Imaging: Nm Hepatobiliary Including Gb  Result Date: 08/07/2016 CLINICAL DATA:  Right upper quadrant pain. Epigastric and right upper quadrant pain since Wednesday. Nausea. No vomiting. Leukocytosis. White count 28 22,000. Ultrasound shows sludge and stones. EXAM: NUCLEAR MEDICINE HEPATOBILIARY IMAGING TECHNIQUE: Sequential images of the abdomen were obtained out to 60 minutes following intravenous administration of radiopharmaceutical. RADIOPHARMACEUTICALS:  5.44 mCi Tc-1417m  Choletec IV COMPARISON:  Ultrasound of the abdomen 08/06/2016 FINDINGS: Prompt uptake and biliary excretion of activity by the liver is seen. Activity is identified within the bowel loops as early as 20 minutes. However there is no activity within the gallbladder at 60 minutes. 2.9 mg of morphine was administered and subsequent imaging to 30 minutes demonstrates no  gallbladder activity. IMPRESSION: Findings are consistent with acute cholecystitis. Electronically Signed   By: Norva Pavlov M.D.   On: 08/07/2016 16:25   Ir Perc Cholecystostomy  Result Date: 08/10/2016 INDICATION: 76 year old male with a history of acute cholecystitis EXAM: CHOLECYSTOSTOMY MEDICATIONS: None ANESTHESIA/SEDATION: Moderate (conscious) sedation was employed during this procedure. A total of Versed 1.0 mg and  Fentanyl 50 mcg was administered intravenously. Moderate Sedation Time: 15 minutes. The patient's level of consciousness and vital signs were monitored continuously by radiology nursing throughout the procedure under my direct supervision. FLUOROSCOPY TIME:  Fluoroscopy Time: 0 minutes 36 seconds (1.8 mGy). COMPLICATIONS: None PROCEDURE: Informed written consent was obtained from the patient and the patient's family after a thorough discussion of the procedural risks, benefits and alternatives. All questions were addressed. Maximal Sterile Barrier Technique was utilized including caps, mask, sterile gowns, sterile gloves, sterile drape, hand hygiene and skin antiseptic. A timeout was performed prior to the initiation of the procedure. Ultrasound survey of the right upper quadrant was performed for planning purposes. Once the patient is prepped and draped in the usual sterile fashion, the skin and subcutaneous tissues overlying the gallbladder were generously infiltrated 1% lidocaine for local anesthesia. A coaxial needle was advanced under ultrasound guidance through the skin subcutaneous tissues and a small segment of liver into the gallbladder lumen. With removal of the stylet, spontaneous dark bile drainage occurred. Using modified Seldinger technique, a 10 French drain was placed into the gallbladder fossa, with aspiration of the sample for the lab. Contrast injection confirmed position of the tube within the gallbladder lumen. Drainage catheter was attached to gravity drain with a suture retention placed. Patient tolerated the procedure well and remained hemodynamically stable throughout. No complications were encountered and no significant blood loss encountered. IMPRESSION: Status post percutaneous cholecystostomy. Signed, Yvone Neu. Loreta Ave, DO [facility] Electronically Signed   By: Gilmer Mor D.O.   On: 08/10/2016 08:14     Labs:  CBC:  Recent Labs  08/07/16 0656 08/08/16 0758  WBC 17.8* 14.1*  HGB 13.7 13.0  HCT 37.5* 35.7*  PLT 151 155    COAGS:  Recent Labs  08/07/16 0656  INR 1.36    BMP:  Recent Labs  08/07/16 0656 08/08/16 0758 08/10/16 1019  NA 140 139 137  K 4.1 4.3 4.3  CL 106 107 102  CO2 26 25 25   GLUCOSE 115* 86 131*  BUN 21* 15 8  CALCIUM 8.2* 8.2* 8.9  CREATININE 1.33* 1.10 0.82  GFRNONAA 50* >60 >60  GFRAA 58* >60 >60    LIVER FUNCTION TESTS:  Recent Labs  08/07/16 0656 08/08/16 0758 08/10/16 1019  BILITOT 3.0* 2.1* 1.1  AST 82* 28 16  ALT 106* 60 29  ALKPHOS 133* 101 83  PROT 6.2* 5.9* 6.5  ALBUMIN 3.2* 2.9* 3.0*    Assessment and Plan: Acute cholecystitis Drain remains in place with continued output. Patient nearing discharge.  Order previously placed for outpatient follow-up.  Patient and caregiver aware of follow-up plans and instructions.   Electronically Signed: Hoyt Koch, PA 08/10/2016, 1:44 PM   I spent a total of 15 Minutes at the the patient's bedside AND on the patient's hospital floor or unit, greater than 50% of which was counseling/coordinating care for acute cholecystitis

## 2016-08-10 NOTE — Discharge Summary (Signed)
Physician Discharge Summary  Seraj Dunnam ZOX:[id number] DOB: [birth year] DOA: 08/06/2016  PCP: Rudi Heap, MD  Admit date: 08/06/2016 Discharge date: 08/10/2016  Time spent: 35 minutes  Recommendations for Outpatient Follow-up:  1. PCP in 1 week 2. IR Drain clinic in 4-5weeks 3. 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 Cholelithiasis with Acute cholecystitis -Surgery consulted, started on IV Abx -Surgery recommended percutaneous drainage, not felt to be a surgical candidate because of severe aortic stenosis -IR consulted, s/p Perc GB drain 6/17 -Blood Cx from [facility], 1 out of 4 bottles with Staph Epidermidis, consistent with contamination -stable now, changed to PO Abx, tolerating diet -FU with General surgery Dr.Wyatt and Interventional radiology in the [facility] clinic   2. Severe aortic stenosis   -Followed by a cardiologist in [place] -Managed conservatively due to her overall poor prognosis with debility and multiple medical problems -Currently asymptomatic, no evidence of CHF -History of PPM  -On digoxin at home -stable, bed/wheel chair bound at baseline  3. HTN -stable on  Amlodipine, toprol  4. DM - continue levemir, stable  5. Hypothyroidism --levothyroxine  6. Chronic Cerebellar ataxia --bed/wheelchair bound  at baseline  Procedures:  6/17:  Image guided percutaneous cholecystostomy.  by Shelbie Proctor, interventional Radiology  Consultations:  [facility]  IR  Discharge Exam: Vitals:   08/10/16 0810 08/10/16 0811  BP: (!) 138/56   Pulse:  66  Resp:    Temp:      General: AAOx3 Cardiovascular: S1S2/RRR Respiratory: CTAB  Discharge Instructions   Discharge Instructions    Diet - low sodium heart healthy    Complete by:  As directed    Increase activity slowly    Complete by:  As directed      Current Discharge Medication List    START taking these medications   Details  cephALEXin (KEFLEX) 250 MG capsule Take 1 capsule (250 mg total) by mouth 4 (four) times daily. For 5days Qty: 30 capsule, Refills: 0    HYDROcodone-acetaminophen (NORCO/VICODIN) 5-325 MG tablet Take 1-2 tablets by mouth every 6 (six) hours as needed for moderate pain. Qty: 30 tablet, Refills: 0    metroNIDAZOLE (FLAGYL) 250 MG tablet Take 1 tablet (250 mg total) by mouth 3 (three) times daily. For 5days Qty: 15 tablet, Refills: 0    polyethylene glycol (MIRALAX / GLYCOLAX) packet Take 17 g by mouth daily as needed. Qty: 14 each, Refills: 0      CONTINUE these medications which have NOT CHANGED   Details  aspirin EC 81 MG tablet Take 81 mg by mouth daily.    digoxin (LANOXIN) 0.125 MG tablet Take 0.0625-0.125 mg by mouth daily. Pt takes 0.0625 mg on Tuesday and Thursday; Pt takes 0.125 mg on Monday, Wednesday, Friday, Saturday and Sunday    diphenhydramine-acetaminophen (TYLENOL PM) 25-500 MG TABS tablet Take 1  tablet by mouth at bedtime.    gabapentin (NEURONTIN) 100 MG capsule Take 100 mg by mouth daily.    insulin detemir (LEVEMIR) 100 UNIT/ML injection Inject 8 Units into the skin at bedtime.    levothyroxine (SYNTHROID, LEVOTHROID) 50 MCG tablet Take 50 mcg by mouth daily before breakfast.    metoprolol succinate (TOPROL-XL) 50 MG 24 hr tablet Take 25 mg by mouth daily. Take with or  immediately following a meal.    naproxen sodium (ANAPROX) 220 MG tablet Take 220 mg by mouth daily.    omeprazole (PRILOSEC) 20 MG capsule Take 20 mg by mouth every morning.    travoprost, benzalkonium, (TRAVATAN) 0.004 % ophthalmic solution Place 1 drop into both eyes at bedtime.       Not on File Follow-up Information    Jimmye Norman, MD. Call.   Specialty:  General Surgery Why:  call to make an appointment with Dr. Lindie Spruce after drain study with interventional radiology Contact information: [address] [place] [address] [phone]        Gilmer Mor, DO Follow up in 5 week(s).   Specialty:  Interventional Radiology Why:  our office will call you with an appointment date and time Contact information: [address] [place] [address] [phone]        [facility] Internal Medicine Patsi Sears. Go on 09/10/2016.   Why:  Hospital post follow up scheduled for 09/10/2016 at 10:00 am with Dr. Lorenda Ishihara. Please arrive at 9:45 am. Contact information:     [address] [place], [place]  [address]   Phone: [phone]           The results of significant diagnostics from this hospitalization (including imaging, microbiology, ancillary and laboratory) are listed below for reference.    Significant Diagnostic Studies: Nm Hepatobiliary Including Gb  Result Date: 08/07/2016 CLINICAL DATA:  Right upper quadrant pain. Epigastric and right upper quadrant pain since Wednesday. Nausea. No vomiting. Leukocytosis. White count 28 22,000. Ultrasound shows sludge and stones. EXAM: NUCLEAR MEDICINE HEPATOBILIARY IMAGING TECHNIQUE: Sequential images of the abdomen were obtained out to 60 minutes following intravenous administration of radiopharmaceutical. RADIOPHARMACEUTICALS:  5.44 mCi Tc-90m  Choletec IV COMPARISON:  Ultrasound of the abdomen 08/06/2016 FINDINGS: Prompt uptake and biliary excretion of activity by the liver is seen.  Activity is identified within the bowel loops as early as 20 minutes. However there is no activity within the gallbladder at 60 minutes. 2.9 mg of morphine was administered and subsequent imaging to 30 minutes demonstrates no gallbladder activity. IMPRESSION: Findings are consistent with acute cholecystitis. Electronically Signed   By: Norva Pavlov M.D.   On: 08/07/2016 16:25   Ir Perc Cholecystostomy  Result Date: 08/10/2016 INDICATION: 77 year old male with a history of acute cholecystitis EXAM: CHOLECYSTOSTOMY MEDICATIONS: None ANESTHESIA/SEDATION: Moderate (conscious) sedation was employed during this procedure. A total of Versed 1.0 mg and Fentanyl 50 mcg was administered intravenously. Moderate Sedation Time: 15 minutes. The patient's level of consciousness and vital signs were monitored continuously by radiology nursing throughout the procedure under my direct supervision. FLUOROSCOPY TIME:  Fluoroscopy Time: 0 minutes 36 seconds (1.8 mGy). COMPLICATIONS: None PROCEDURE: Informed written consent was obtained from the patient and the patient's family after a thorough discussion of the procedural risks, benefits and alternatives. All questions were addressed. Maximal Sterile Barrier Technique was utilized including caps, mask, sterile gowns, sterile gloves, sterile drape, hand hygiene and skin antiseptic. A timeout was performed prior to  the initiation of the procedure. Ultrasound survey of the right upper quadrant was performed for planning purposes. Once the patient is prepped and draped in the usual sterile fashion, the skin and subcutaneous tissues overlying the gallbladder were generously infiltrated 1% lidocaine for local anesthesia. A coaxial needle was advanced under ultrasound guidance through the skin subcutaneous tissues and a small segment of liver into the gallbladder lumen. With removal of the stylet, spontaneous dark bile drainage occurred. Using modified Seldinger technique, a 10 French  drain was placed into the gallbladder fossa, with aspiration of the sample for the lab. Contrast injection confirmed position of the tube within the gallbladder lumen. Drainage catheter was attached to gravity drain with a suture retention placed. Patient tolerated the procedure well and remained hemodynamically stable throughout. No complications were encountered and no significant blood loss encountered. IMPRESSION: Status post percutaneous cholecystostomy. Signed, Yvone NeuJaime S. Loreta AveWagner, DO [facility] Electronically Signed   By: Gilmer MorJaime  Wagner D.O.   On: 08/10/2016 08:14    Microbiology: Recent Results (from the past 240 hour(s))  Culture, body fluid-bottle     Status: Abnormal (Preliminary result)   Collection Time: 08/08/16 11:00 AM  Result Value Ref Range Status   Specimen Description BILE  Final   Special Requests NONE  Final   Gram Stain   Final    GRAM NEGATIVE RODS IN BOTH AEROBIC AND ANAEROBIC BOTTLES CONSISTENT WITH PREVIOUS RESULT    Culture ESCHERICHIA COLI HOLDING FOR POSSIBLE ANAEROBE  (A)  Final   Report Status PENDING  Incomplete   Organism ID, Bacteria ESCHERICHIA COLI  Final      Susceptibility   Escherichia coli - MIC*    AMPICILLIN >=32 RESISTANT Resistant     CEFAZOLIN <=4 SENSITIVE Sensitive     CEFEPIME <=1 SENSITIVE Sensitive     CEFTAZIDIME <=1 SENSITIVE Sensitive     CEFTRIAXONE <=1 SENSITIVE Sensitive     CIPROFLOXACIN <=0.25 SENSITIVE Sensitive     GENTAMICIN <=1 SENSITIVE Sensitive     IMIPENEM <=0.25 SENSITIVE Sensitive     TRIMETH/SULFA <=20 SENSITIVE Sensitive     AMPICILLIN/SULBACTAM 16 INTERMEDIATE Intermediate     PIP/TAZO 16 SENSITIVE Sensitive     Extended ESBL NEGATIVE Sensitive     * ESCHERICHIA COLI  Gram stain     Status: None   Collection Time: 08/08/16 11:00 AM  Result Value Ref Range Status   Specimen Description BILE  Final   Special Requests NONE  Final   Gram Stain   Final     RARE WBC PRESENT,BOTH PMN AND MONONUCLEAR MODERATE GRAM NEGATIVE RODS    Report Status 08/08/2016 FINAL  Final     Labs: Basic Metabolic Panel:  Recent Labs Lab 08/07/16 0656 08/08/16 0758 08/10/16 1019  NA 140 139 137  K 4.1 4.3 4.3  CL 106 107 102  CO2 26 25 25   GLUCOSE 115* 86 131*  BUN 21* 15 8  CREATININE 1.33* 1.10 0.82  CALCIUM 8.2* 8.2* 8.9   Liver Function Tests:  Recent Labs Lab 08/07/16 0656 08/08/16 0758 08/10/16 1019  AST 82* 28 16  ALT 106* 60 29  ALKPHOS 133* 101 83  BILITOT 3.0* 2.1* 1.1  PROT 6.2* 5.9* 6.5  ALBUMIN 3.2* 2.9* 3.0*   No results for input(s): LIPASE, AMYLASE in the last 168 hours. No results for input(s): AMMONIA in the last 168 hours. CBC:  Recent Labs Lab 08/07/16 0656 08/08/16 0758  WBC 17.8* 14.1*  NEUTROABS 13.6*  --  HGB 13.7 13.0  HCT 37.5* 35.7*  MCV 85.4 85.0  PLT 151 155   Cardiac Enzymes: No results for input(s): CKTOTAL, CKMB, CKMBINDEX, TROPONINI in the last 168 hours. BNP: BNP (last 3 results) No results for input(s): BNP in the last 8760 hours.  ProBNP (last 3 results) No results for input(s): PROBNP in the last 8760 hours.  CBG:  Recent Labs Lab 08/09/16 1229 08/09/16 1651 08/09/16 2145 08/10/16 0742 08/10/16 1146  GLUCAP 161* 145* 198* 136* 136*       Signed:  Miranda,Charon Akamine MD.  [facility] 08/10/2016, 1:45 PM

## 2016-08-10 NOTE — Progress Notes (Signed)
Unk PintoJoseph Panella to be D/C'd Home per MD order.  Discussed with the patient and all questions fully answered.  VSS, Skin clean, dry and intact without evidence of skin break down, no evidence of skin tears noted. IV catheter discontinued intact. Site without signs and symptoms of complications. Dressing and pressure applied.  An After Visit Summary was printed and given to the patient. Patient received prescription.  D/c education completed with patient/family including follow up instructions, medication list, d/c activities limitations if indicated, with other d/c instructions as indicated by MD - patient able to verbalize understanding, all questions fully answered.   Patient instructed to return to ED, call 911, or call MD for any changes in condition.   Patient escorted via WC, and D/C home via private auto.  Eligah Eastrin M Shadawn Hanaway 08/10/2016 2:28 PM

## 2016-08-10 NOTE — Progress Notes (Signed)
[facility]/Trauma Progress Note      Subjective:  CC: S/P per chole drain 6/17, mild abdominal pain  Pt states his pain is improved since drain. He does not have much of an appetite. Ex-wife (caregiver) at bedside and is concerned about the pt's blood culture results. I informed her that they are still pending and Dr. Jomarie LongsJoseph would speak to her today about those.  Pt has not had a BM since admission. He is having flatus. He is tolerating his diet. No fever or chills overnight.   Objective: Vital signs in last 24 hours: Temp:  [97.7 F (36.5 C)-99.8 F (37.7 C)] 97.9 F (36.6 C) (06/19 0559) Pulse Rate:  [66-83] 66 (06/19 0811) Resp:  [16-18] 18 (06/19 0559) BP: (138-155)/(53-56) 138/56 (06/19 0810) SpO2:  [97 %-100 %] 97 % (06/19 0559) Last BM Date: 08/06/16  Intake/Output from previous day: 06/18 0701 - 06/19 0700 In: 480 [P.O.:480] Out: 135 [Drains:135] Intake/Output this shift: No intake/output data recorded.  PE: Gen:  Alert, NAD, pleasant, cooperative Card:  RRR, + systolic murmur Pulm:  Rate and effort normal Abd: Soft, ND, +BS, drain with minimal bilious drainage, TTP around drain site, no hernias noted Skin: no rashes noted, warm and dry  Lab Results:   Recent Labs  08/08/16 0758  WBC 14.1*  HGB 13.0  HCT 35.7*  PLT 155   BMET  Recent Labs  08/08/16 0758  NA 139  K 4.3  CL 107  CO2 25  GLUCOSE 86  BUN 15  CREATININE 1.10  CALCIUM 8.2*   PT/INR No results for input(s): LABPROT, INR in the last 72 hours. CMP     Component Value Date/Time   NA 139 08/08/2016 0758   K 4.3 08/08/2016 0758   CL 107 08/08/2016 0758   CO2 25 08/08/2016 0758   GLUCOSE 86 08/08/2016 0758   BUN 15 08/08/2016 0758   CREATININE 1.10 08/08/2016 0758   CALCIUM 8.2 (L) 08/08/2016 0758   PROT 5.9 (L) 08/08/2016 0758   ALBUMIN 2.9 (L) 08/08/2016 0758   AST 28 08/08/2016 0758   ALT 60 08/08/2016 0758   ALKPHOS 101 08/08/2016 0758   BILITOT 2.1 (H)  08/08/2016 0758   GFRNONAA >60 08/08/2016 0758   GFRAA >60 08/08/2016 0758   Lipase  No results found for: LIPASE  Studies/Results: Ir Perc Cholecystostomy  Result Date: 08/10/2016 INDICATION: 76 year old male with a history of acute cholecystitis EXAM: CHOLECYSTOSTOMY MEDICATIONS: None ANESTHESIA/SEDATION: Moderate (conscious) sedation was employed during this procedure. A total of Versed 1.0 mg and Fentanyl 50 mcg was administered intravenously. Moderate Sedation Time: 15 minutes. The patient's level of consciousness and vital signs were monitored continuously by radiology nursing throughout the procedure under my direct supervision. FLUOROSCOPY TIME:  Fluoroscopy Time: 0 minutes 36 seconds (1.8 mGy). COMPLICATIONS: None PROCEDURE: Informed written consent was obtained from the patient and the patient's family after a thorough discussion of the procedural risks, benefits and alternatives. All questions were addressed. Maximal Sterile Barrier Technique was utilized including caps, mask, sterile gowns, sterile gloves, sterile drape, hand hygiene and skin antiseptic. A timeout was performed prior to the initiation of the procedure. Ultrasound survey of the right upper quadrant was performed for planning purposes. Once the patient is prepped and draped in the usual sterile fashion, the skin and subcutaneous tissues overlying the gallbladder were generously infiltrated 1% lidocaine for local anesthesia. A coaxial needle was advanced under ultrasound guidance through the skin subcutaneous tissues and a small segment of  liver into the gallbladder lumen. With removal of the stylet, spontaneous dark bile drainage occurred. Using modified Seldinger technique, a 10 French drain was placed into the gallbladder fossa, with aspiration of the sample for the lab. Contrast injection confirmed position of the tube within the gallbladder lumen. Drainage catheter was attached to gravity drain with a suture retention placed.  Patient tolerated the procedure well and remained hemodynamically stable throughout. No complications were encountered and no significant blood loss encountered. IMPRESSION: Status post percutaneous cholecystostomy. Signed, Yvone Neu. Loreta Ave, DO [facility] Radiology Electronically Signed   By: Gilmer Mor D.O.   On: 08/10/2016 08:14    Anti-infectives: Anti-infectives    Start     Dose/Rate Route Frequency Ordered Stop   08/09/16 1500  amoxicillin-clavulanate (AUGMENTIN) 875-125 MG per tablet 1 tablet     1 tablet Oral Every 12 hours 08/09/16 1443     08/07/16 0800  piperacillin-tazobactam (ZOSYN) IVPB 3.375 g  Status:  Discontinued     3.375 g 12.5 mL/hr over 240 Minutes Intravenous Every 8 hours 08/06/16 2345 08/09/16 1443   08/07/16 0000  piperacillin-tazobactam (ZOSYN) IVPB 3.375 g     3.375 g 100 mL/hr over 30 Minutes Intravenous STAT 08/06/16 2345 08/07/16 0043       Assessment/Plan Severe aortic stenosis >> poor surgical candidate HTN DM Hypothyroidism Chronic cerebellar ataxia  Acute cholecystitis - HIDA 6/16 positive  - s/p perc chole 6/17, culture pending - blood cultures with gram neg rods, identification and susceptibility pending  ID - zosyn 6/16>> FEN - regular diet, added colace and miralax  VTE - SCDs, ok to restart lovenox from our standpoint  Plan - continue drain and antibiotics.  Patient will need follow-up in drain clinic at discharge, and then with Dr. Lindie Spruce.    LOS: 4 days    Jerre Simon , [facility] Surgery 08/10/2016, 8:56 AM Pager: [phone] Consults: [phone] Mon-Fri 7:00 am-4:30 pm Sat-Sun 7:00 am-11:30 am

## 2016-08-10 NOTE — Progress Notes (Signed)
Received call from [facility] needed actual physical address. Contacted pt's caregiver, Jonah BlueDeborah Johnson. [address] [place], [place] [address]. Provided information to [facility]. Will fax dc summary when available.  Isidoro DonningAlesia Lalena Salas RN CCM Case Mgmt phone [phone]

## 2016-08-10 NOTE — Progress Notes (Signed)
NCM faxed d/c summary to  # [phone]. Gae GallopAngela Rand Etchison RN,BSN,CM

## 2016-08-11 LAB — CULTURE, BODY FLUID-BOTTLE

## 2016-08-11 LAB — CULTURE, BODY FLUID W GRAM STAIN -BOTTLE

## 2016-09-07 ENCOUNTER — Encounter (HOSPITAL_COMMUNITY): Payer: Self-pay | Admitting: Interventional Radiology

## 2016-09-07 ENCOUNTER — Other Ambulatory Visit: Payer: Medicare Other

## 2016-09-07 ENCOUNTER — Other Ambulatory Visit: Payer: Self-pay | Admitting: General Surgery

## 2016-09-07 ENCOUNTER — Ambulatory Visit (HOSPITAL_COMMUNITY)
Admission: RE | Admit: 2016-09-07 | Discharge: 2016-09-07 | Disposition: A | Discharge status: Home / Self Care | Payer: Medicare Other | Source: Ambulatory Visit | Attending: General Surgery | Admitting: General Surgery

## 2016-09-07 DIAGNOSIS — Z4803 Encounter for change or removal of drains: Secondary | ICD-10-CM | POA: Insufficient documentation

## 2016-09-07 DIAGNOSIS — K810 Acute cholecystitis: Secondary | ICD-10-CM

## 2016-09-07 HISTORY — PX: IR CHOLANGIOGRAM EXISTING TUBE: IMG6040

## 2016-09-07 MED ORDER — IOPAMIDOL (ISOVUE-300) INJECTION 61%
INTRAVENOUS | Status: AC
  Administered 2016-09-07: 10 mL
  Filled 2016-09-07: qty 50

## 2016-09-10 ENCOUNTER — Other Ambulatory Visit: Payer: Medicare Other

## 2016-10-05 ENCOUNTER — Other Ambulatory Visit: Payer: Medicare Other

## 2016-10-05 ENCOUNTER — Ambulatory Visit
Admission: RE | Admit: 2016-10-05 | Discharge: 2016-10-05 | Disposition: A | Discharge status: Home / Self Care | Payer: Medicare Other | Source: Ambulatory Visit | Attending: General Surgery | Admitting: General Surgery

## 2016-10-05 DIAGNOSIS — K810 Acute cholecystitis: Secondary | ICD-10-CM

## 2016-10-05 HISTORY — PX: IR RADIOLOGIST EVAL & MGMT: IMG5224

## 2016-10-05 NOTE — Progress Notes (Signed)
Patient ID: Benjamin Thornton, male   DOB: 1941/02/16, 76 y.o.   MRN: [id number]       Chief Complaint:  Recent cholecystitis, status post percutaneous cholecystostomy.  Referring Physician(s): Ramirez,Armando  History of Present Illness: Benjamin Thornton is a 76 y.o. male who had a percutaneous cholecystostomy placed for cholecystitis 08/08/2016. One month after insertion the patient was seen for catheter-related pain. Contrast injection at that time confirms appropriate position of the catheter. Cholangiogram in July confirms patency of the cystic duct and common bile duct. Patient returns for outpatient management. He has completed antibiotics. Catheter has been to gravity drainage for 1 month. No interval fevers. Stable weight and diet. He has a good appetite. Normal bowel habits. No diarrhea. Overall he is feeling better. No significant abdominal or flank pain. Output from the catheter is 50-100 mL of bile daily.  Past Medical History:  Diagnosis Date  . Arthritis    "left knuckle" (08/06/2016)  . 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 C. difficile colitis   . Diabetes mellitus without complication (HCC) dx'd 1990s   "I've never taken pills; I've always used the pen" (08/06/2016)  . Diverticulitis   . Heart murmur   . History of kidney stones    "passed them"  . Hypertension   . Presence of permanent cardiac pacemaker   . Severe aortic stenosis     Past Surgical History:  Procedure Laterality Date  . CARDIAC CATHETERIZATION    . COLECTOMY  1990s   partial; "12 inches; diverticulitis"  . HERNIA REPAIR    . INSERT / REPLACE / REMOVE PACEMAKER  2012; 01/2016  . IR CHOLANGIOGRAM EXISTING TUBE  09/07/2016  . IR PERC CHOLECYSTOSTOMY  08/08/2016  . SHOULDER ARTHROSCOPY WITH OPEN ROTATOR CUFF REPAIR Left   . TESTICLE SURGERY Right 1949   it wasn't descended  . TONSILLECTOMY    . UMBILICAL HERNIA REPAIR      Allergies: Patient has no allergy  information on record.  Medications: Prior to Admission medications   Medication Sig Start Date End Date Taking? Authorizing Provider  aspirin EC 81 MG tablet Take 81 mg by mouth daily.    [provider]  cephALEXin (KEFLEX) 250 MG capsule Take 1 capsule (250 mg total) by mouth 4 (four) times daily. For 5days 08/10/16   Zannie Cove, MD  digoxin (LANOXIN) 0.125 MG tablet Take 0.0625-0.125 mg by mouth daily. Pt takes 0.0625 mg on Tuesday and Thursday; Pt takes 0.125 mg on Monday, Wednesday, Friday, Saturday and Sunday    [provider]  diphenhydramine-acetaminophen (TYLENOL PM) 25-500 MG TABS tablet Take 1 tablet by mouth at bedtime.    [provider]  gabapentin (NEURONTIN) 100 MG capsule Take 100 mg by mouth daily.    [provider]  HYDROcodone-acetaminophen (NORCO/VICODIN) 5-325 MG tablet Take 1-2 tablets by mouth every 6 (six) hours as needed for moderate pain. 08/10/16   Zannie Cove, MD  insulin detemir (LEVEMIR) 100 UNIT/ML injection Inject 8 Units into the skin at bedtime.    [provider]  levothyroxine (SYNTHROID, LEVOTHROID) 50 MCG tablet Take 50 mcg by mouth daily before breakfast.    [provider]  metoprolol succinate (TOPROL-XL) 50 MG 24 hr tablet Take 25 mg by mouth daily. Take with or immediately following a meal.    [provider]  metroNIDAZOLE (FLAGYL) 250 MG tablet Take 1 tablet (250 mg total) by mouth 3 (three) times daily. For  5days 08/10/16   Zannie CoveJoseph, Preetha, MD  naproxen sodium (ANAPROX) 220 MG tablet Take 220 mg by mouth daily.    [provider]  omeprazole (PRILOSEC) 20 MG capsule Take 20 mg by mouth every morning.    [provider]  polyethylene glycol (MIRALAX / GLYCOLAX) packet Take 17 g by mouth daily as needed. 08/10/16   Zannie CoveJoseph, Preetha, MD  travoprost, benzalkonium, (TRAVATAN) 0.004 % ophthalmic solution Place 1 drop into both eyes at bedtime.    [provider]       No family history on file.  Social History   Social History  . Marital status: Divorced    Spouse name: N/A  . Number of children: N/A  . Years of education: N/A   Social History Main Topics  . Smoking status: Never Smoker  . Smokeless tobacco: Current User    Types: Chew  . Alcohol use No  . Drug use: No  . Sexual activity: Not on file   Other Topics Concern  . Not on file   Social History Narrative  . No narrative on file      Review of Systems: A 12 point ROS discussed and pertinent positives are indicated in the HPI above.  All other systems are negative.  Review of Systems  Vital Signs: BP 140/61   Pulse 60   Temp 97.9 F (36.6 C) (Oral)   SpO2 99%   Physical Exam  Constitutional: He appears well-developed and well-nourished. No distress.  Eyes: Conjunctivae are normal. No scleral icterus.  Abdominal: Soft. Bowel sounds are normal. He exhibits no distension. There is no tenderness. There is no guarding.  Cholecystostomy is clean, dry and intact. No signs of localized infection or cellulitis. Bile within the collection bag.  Skin: He is not diaphoretic.      Imaging: Ir Cholangiogram Existing Tube  Result Date: 09/07/2016 INDICATION: Status post percutaneous cholecystostomy tube placement on 08/08/2016 to treat acute cholecystitis. The patient complains of discomfort at the cholecystostomy tube insertion site. EXAM: CHOLANGIOGRAM VIA EXISTING CATHETER MEDICATIONS: None ANESTHESIA/SEDATION: None FLUOROSCOPY TIME:  Fluoroscopy Time: 12 seconds.  2.2 mGy. CONTRAST:  10 mL [id number] COMPLICATIONS: None immediate. PROCEDURE: Informed written consent was obtained from the patient after a thorough discussion of the procedural risks, benefits and alternatives. All questions were addressed. Maximal Sterile Barrier Technique was utilized including caps, mask, sterile gowns, sterile gloves, sterile drape, hand hygiene and skin antiseptic. A timeout was performed  prior to the initiation of the procedure. Contrast was injected through the pre-existing cholecystostomy tube. Fluoroscopic spot images were saved. The tube was then flushed and connected to a gravity drainage bag. FINDINGS: The cholecystostomy tube is in stable position within the gallbladder lumen and shows noted dislodgement. There is no leakage of contrast into the peritoneal cavity. Leaked into the contrast injection, contrast does exit the cystic duct and enters the common bile duct. IMPRESSION: Stable positioning of cholecystostomy tube in the gallbladder lumen. The cystic duct does appear to be open. Plans will be made for repeat tube injection in roughly one month to determine if a trial of capping of the tube may be feasible given evidence today that the cystic duct is open. Electronically Signed   By: Irish LackGlenn  Yamagata M.D.   On: 09/07/2016 16:58   Dg Cholangiogram  Existing Tube  Result Date: 10/05/2016 INDICATION: Calculus cholecystitis, status post percutaneous cholecystostomy 08/08/2016 EXAM: CHOLANGIOGRAM THROUGH EXISTING CHOLECYSTOSTOMY MEDICATIONS: NONE ANESTHESIA/SEDATION: None. FLUOROSCOPY TIME:  Fluoroscopy Time: 2 minutes 24 seconds (  172 mGy). COMPLICATIONS: None immediate. PROCEDURE: Contrast injected through the existing cholecystostomy for a cholangiogram. Fluoroscopic imaging performed. Gallbladder was distended with approximately 40 cc contrast and 20 cc saline. Small filling defects compatible with residual gallstones. There are small nonobstructing filling defects within the cystic duct compatible with cystic duct stones. Contrast eventually drains into the common bile duct and into the duodenum without obstruction. Incidental duodenal diverticulum evident. IMPRESSION: Patent cystic duct and common bile duct. Cholelithiasis and small nonobstructing cystic duct stones. Electronically Signed   By: Judie Petit.  Manly Nestle M.D.   On: 10/05/2016 14:08    Labs:  CBC:  Recent Labs  08/07/16 0656  08/08/16 0758 08/10/16 1216  WBC 17.8* 14.1* 18.0*  HGB 13.7 13.0 14.0  HCT 37.5* 35.7* 37.9*  PLT 151 155 187    COAGS:  Recent Labs  08/07/16 0656  INR 1.36    BMP:  Recent Labs  08/07/16 0656 08/08/16 0758 08/10/16 1019  NA 140 139 137  K 4.1 4.3 4.3  CL 106 107 102  CO2 26 25 25   GLUCOSE 115* 86 131*  BUN 21* 15 8  CALCIUM 8.2* 8.2* 8.9  CREATININE 1.33* 1.10 0.82  GFRNONAA 50* >60 >60  GFRAA 58* >60 >60    LIVER FUNCTION TESTS:  Recent Labs  08/07/16 0656 08/08/16 0758 08/10/16 1019  BILITOT 3.0* 2.1* 1.1  AST 82* 28 16  ALT 106* 60 29  ALKPHOS 133* 101 83  PROT 6.2* 5.9* 6.5  ALBUMIN 3.2* 2.9* 3.0*     Assessment and Plan:  Acute cholecystitis, nonoperative candidate related to his cardiac history. Patient status post percutaneous cholecystostomy approximately 2 months ago. Plans grams today confirms patency of the cystic duct and common bile duct.  Plan: Recommend capTrial and if tolerated consider catheter removal. Patient has surgery follow-up in 2 days. She was also given a gravity drainage bag, if he were to develop any symptoms she understands to reconnect to gravity drainage.    Electronically Signed: Berdine Dance 10/05/2016, 3:37 PM   I spent a total of    25 Minutes in face to face in clinical consultation, greater than 50% of which was counseling/coordinating care for this patient with a percutaneous cholecystostomy

## 2016-11-12 ENCOUNTER — Other Ambulatory Visit (HOSPITAL_COMMUNITY): Payer: Self-pay | Admitting: Diagnostic Radiology

## 2016-11-12 DIAGNOSIS — K810 Acute cholecystitis: Secondary | ICD-10-CM

## 2016-11-17 ENCOUNTER — Encounter (HOSPITAL_COMMUNITY): Payer: Self-pay | Admitting: Interventional Radiology

## 2016-11-17 ENCOUNTER — Other Ambulatory Visit (HOSPITAL_COMMUNITY): Payer: Self-pay | Admitting: Interventional Radiology

## 2016-11-17 ENCOUNTER — Ambulatory Visit (HOSPITAL_COMMUNITY)
Admission: RE | Admit: 2016-11-17 | Discharge: 2016-11-17 | Disposition: A | Discharge status: Home / Self Care | Payer: Medicare Other | Source: Ambulatory Visit | Attending: Diagnostic Radiology | Admitting: Diagnostic Radiology

## 2016-11-17 ENCOUNTER — Ambulatory Visit (HOSPITAL_COMMUNITY): Payer: Medicare Other

## 2016-11-17 ENCOUNTER — Other Ambulatory Visit (HOSPITAL_COMMUNITY): Payer: Self-pay | Admitting: Diagnostic Radiology

## 2016-11-17 DIAGNOSIS — Z0000 Encounter for general adult medical examination without abnormal findings: Secondary | ICD-10-CM

## 2016-11-17 DIAGNOSIS — Z4682 Encounter for fitting and adjustment of non-vascular catheter: Secondary | ICD-10-CM | POA: Diagnosis present

## 2016-11-17 DIAGNOSIS — K810 Acute cholecystitis: Secondary | ICD-10-CM

## 2016-11-17 DIAGNOSIS — K8020 Calculus of gallbladder without cholecystitis without obstruction: Secondary | ICD-10-CM | POA: Diagnosis not present

## 2016-11-17 HISTORY — PX: IR EXCHANGE BILIARY DRAIN: IMG6046

## 2016-11-17 MED ORDER — LIDOCAINE HCL (PF) 1 % IJ SOLN
INTRAMUSCULAR | Status: DC | PRN
  Administered 2016-11-17: 10 mL

## 2016-11-17 MED ORDER — LIDOCAINE HCL 1 % IJ SOLN
INTRAMUSCULAR | Status: AC
  Filled 2016-11-17: qty 20

## 2016-11-17 MED ORDER — IOPAMIDOL (ISOVUE-300) INJECTION 61%
INTRAVENOUS | Status: AC
  Administered 2016-11-17: 10 mL
  Filled 2016-11-17: qty 50

## 2016-12-06 ENCOUNTER — Encounter: Payer: Self-pay | Admitting: Interventional Radiology

## 2017-01-14 ENCOUNTER — Other Ambulatory Visit: Payer: Self-pay | Admitting: Radiology

## 2017-01-17 ENCOUNTER — Inpatient Hospital Stay (HOSPITAL_COMMUNITY): Admission: RE | Admit: 2017-01-17 | Payer: Medicare Other | Source: Ambulatory Visit

## 2017-01-21 ENCOUNTER — Other Ambulatory Visit (HOSPITAL_COMMUNITY): Payer: Medicare Other

## 2017-01-24 ENCOUNTER — Encounter (HOSPITAL_COMMUNITY): Payer: Self-pay | Admitting: Interventional Radiology

## 2017-01-24 ENCOUNTER — Other Ambulatory Visit (HOSPITAL_COMMUNITY): Payer: Self-pay | Admitting: Interventional Radiology

## 2017-01-24 ENCOUNTER — Ambulatory Visit (HOSPITAL_COMMUNITY)
Admission: RE | Admit: 2017-01-24 | Discharge: 2017-01-24 | Disposition: A | Discharge status: Home / Self Care | Payer: Medicare Other | Source: Ambulatory Visit | Attending: Interventional Radiology | Admitting: Interventional Radiology

## 2017-01-24 DIAGNOSIS — Z4682 Encounter for fitting and adjustment of non-vascular catheter: Secondary | ICD-10-CM | POA: Diagnosis present

## 2017-01-24 DIAGNOSIS — K8000 Calculus of gallbladder with acute cholecystitis without obstruction: Secondary | ICD-10-CM | POA: Diagnosis not present

## 2017-01-24 DIAGNOSIS — Z0000 Encounter for general adult medical examination without abnormal findings: Secondary | ICD-10-CM

## 2017-01-24 HISTORY — PX: IR EXCHANGE BILIARY DRAIN: IMG6046

## 2017-01-24 MED ORDER — LIDOCAINE HCL (PF) 1 % IJ SOLN
INTRAMUSCULAR | Status: DC | PRN
  Administered 2017-01-24: 5 mL

## 2017-01-24 MED ORDER — IOPAMIDOL (ISOVUE-300) INJECTION 61%
INTRAVENOUS | Status: AC
  Administered 2017-01-24: 10 mL
  Filled 2017-01-24: qty 50

## 2017-01-24 MED ORDER — LIDOCAINE HCL 1 % IJ SOLN
INTRAMUSCULAR | Status: AC
  Filled 2017-01-24: qty 20

## 2017-01-24 MED ORDER — LIDOCAINE HCL 1 % IJ SOLN
INTRAMUSCULAR | Status: DC | PRN
  Administered 2017-01-24: 10 mL

## 2017-01-24 NOTE — Procedures (Signed)
Interventional Radiology Procedure Note  Procedure: Successful routine exchange of cholecystostomy tube  Complications: None  Estimated Blood Loss: NOne  Recommendations: - DC home - Return in 10-12 wks for next drain exchange  Signed,  Sterling BigHeath K. Diesha Rostad, MD

## 2017-04-18 ENCOUNTER — Ambulatory Visit (HOSPITAL_COMMUNITY): Payer: Medicare Other

## 2017-04-18 ENCOUNTER — Other Ambulatory Visit (HOSPITAL_COMMUNITY): Payer: Medicare Other

## 2017-04-19 ENCOUNTER — Ambulatory Visit (HOSPITAL_COMMUNITY): Payer: Medicare Other

## 2017-04-21 ENCOUNTER — Encounter (HOSPITAL_COMMUNITY): Payer: Self-pay | Admitting: Interventional Radiology

## 2017-04-21 ENCOUNTER — Ambulatory Visit (HOSPITAL_COMMUNITY)
Admission: RE | Admit: 2017-04-21 | Discharge: 2017-04-21 | Disposition: A | Discharge status: Home / Self Care | Payer: Medicare Other | Source: Ambulatory Visit | Attending: Interventional Radiology | Admitting: Interventional Radiology

## 2017-04-21 ENCOUNTER — Other Ambulatory Visit (HOSPITAL_COMMUNITY): Payer: Self-pay | Admitting: Interventional Radiology

## 2017-04-21 DIAGNOSIS — Z0000 Encounter for general adult medical examination without abnormal findings: Secondary | ICD-10-CM

## 2017-04-21 DIAGNOSIS — Z4682 Encounter for fitting and adjustment of non-vascular catheter: Secondary | ICD-10-CM | POA: Insufficient documentation

## 2017-04-21 HISTORY — PX: IR EXCHANGE BILIARY DRAIN: IMG6046

## 2017-04-21 MED ORDER — IOPAMIDOL (ISOVUE-300) INJECTION 61%
INTRAVENOUS | Status: AC
  Filled 2017-04-21: qty 50

## 2017-04-21 MED ORDER — LIDOCAINE HCL 1 % IJ SOLN
INTRAMUSCULAR | Status: DC | PRN
  Administered 2017-04-21: 5 mL

## 2017-04-21 MED ORDER — LIDOCAINE HCL 1 % IJ SOLN
INTRAMUSCULAR | Status: AC
  Filled 2017-04-21: qty 20

## 2017-06-29 ENCOUNTER — Ambulatory Visit (HOSPITAL_COMMUNITY)
Admission: RE | Admit: 2017-06-29 | Discharge: 2017-06-29 | Disposition: A | Discharge status: Home / Self Care | Payer: Medicare Other | Source: Ambulatory Visit | Attending: Interventional Radiology | Admitting: Interventional Radiology

## 2017-06-29 ENCOUNTER — Encounter (HOSPITAL_COMMUNITY): Payer: Self-pay | Admitting: Interventional Radiology

## 2017-06-29 ENCOUNTER — Other Ambulatory Visit (HOSPITAL_COMMUNITY): Payer: Self-pay | Admitting: Interventional Radiology

## 2017-06-29 DIAGNOSIS — Z4682 Encounter for fitting and adjustment of non-vascular catheter: Secondary | ICD-10-CM | POA: Insufficient documentation

## 2017-06-29 DIAGNOSIS — Z0000 Encounter for general adult medical examination without abnormal findings: Secondary | ICD-10-CM

## 2017-06-29 HISTORY — PX: IR EXCHANGE BILIARY DRAIN: IMG6046

## 2017-06-29 MED ORDER — LIDOCAINE HCL (PF) 1 % IJ SOLN
INTRAMUSCULAR | Status: DC | PRN
  Administered 2017-06-29: 5 mL

## 2017-06-29 MED ORDER — LIDOCAINE HCL 1 % IJ SOLN
INTRAMUSCULAR | Status: AC
  Filled 2017-06-29: qty 20

## 2017-06-29 MED ORDER — IOPAMIDOL (ISOVUE-300) INJECTION 61%
INTRAVENOUS | Status: AC
  Administered 2017-06-29: 10 mL
  Filled 2017-06-29: qty 50

## 2017-06-29 NOTE — Procedures (Signed)
10 Fr Chole drain exchange Cystic duct patent EBL 0 Comp 0

## 2017-07-13 ENCOUNTER — Other Ambulatory Visit (HOSPITAL_COMMUNITY): Payer: Medicare Other

## 2017-09-07 ENCOUNTER — Ambulatory Visit (HOSPITAL_COMMUNITY)
Admission: RE | Admit: 2017-09-07 | Discharge: 2017-09-07 | Disposition: A | Discharge status: Home / Self Care | Payer: Medicare Other | Source: Ambulatory Visit | Attending: Interventional Radiology | Admitting: Interventional Radiology

## 2017-09-07 ENCOUNTER — Encounter (HOSPITAL_COMMUNITY): Payer: Self-pay | Admitting: Interventional Radiology

## 2017-09-07 ENCOUNTER — Other Ambulatory Visit (HOSPITAL_COMMUNITY): Payer: Self-pay | Admitting: Interventional Radiology

## 2017-09-07 DIAGNOSIS — Z4803 Encounter for change or removal of drains: Secondary | ICD-10-CM | POA: Insufficient documentation

## 2017-09-07 DIAGNOSIS — K819 Cholecystitis, unspecified: Secondary | ICD-10-CM

## 2017-09-07 DIAGNOSIS — Z0000 Encounter for general adult medical examination without abnormal findings: Secondary | ICD-10-CM

## 2017-09-07 HISTORY — PX: IR EXCHANGE BILIARY DRAIN: IMG6046

## 2017-09-07 MED ORDER — LIDOCAINE HCL 1 % IJ SOLN
INTRAMUSCULAR | Status: DC | PRN
  Administered 2017-09-07: 2 mL

## 2017-09-07 MED ORDER — IOPAMIDOL (ISOVUE-300) INJECTION 61%
INTRAVENOUS | Status: AC
  Administered 2017-09-07: 10 mL
  Filled 2017-09-07: qty 50

## 2017-09-07 MED ORDER — LIDOCAINE HCL 1 % IJ SOLN
INTRAMUSCULAR | Status: AC
  Filled 2017-09-07: qty 20

## 2017-10-26 IMAGING — XA IR EXCHANGE BILARY DRAIN
3 series · 4 of 4 positions shown · non-contrast
Comparison: none

INDICATION: 76-year-old male with a history of calculus cholecystitis. He was
considered a poor operative candidate secondary to is cardiac status
and will likely not be an operative candidate in the future. A
percutaneous cholecystostomy tube was placed on 08/08/2016. Prior
cholangiograms have demonstrated patency of the cystic duct but
persistent cholelithiasis. His tube is currently capped.

[Series 1: fl neuro · 1 of 1 slices shown]
[im 1/1]
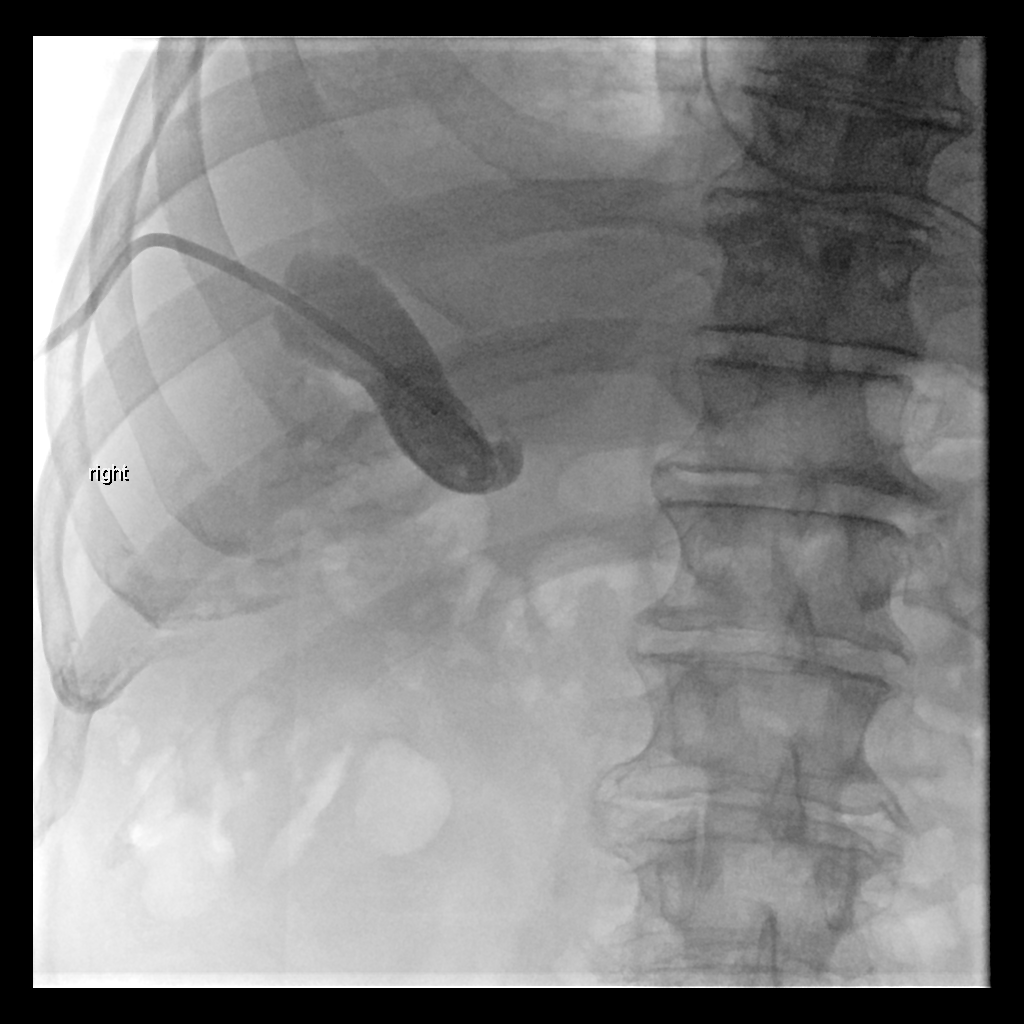

[Series 2: fl angio · 1 of 1 slices shown (1 of 2)]
[im 1/1]
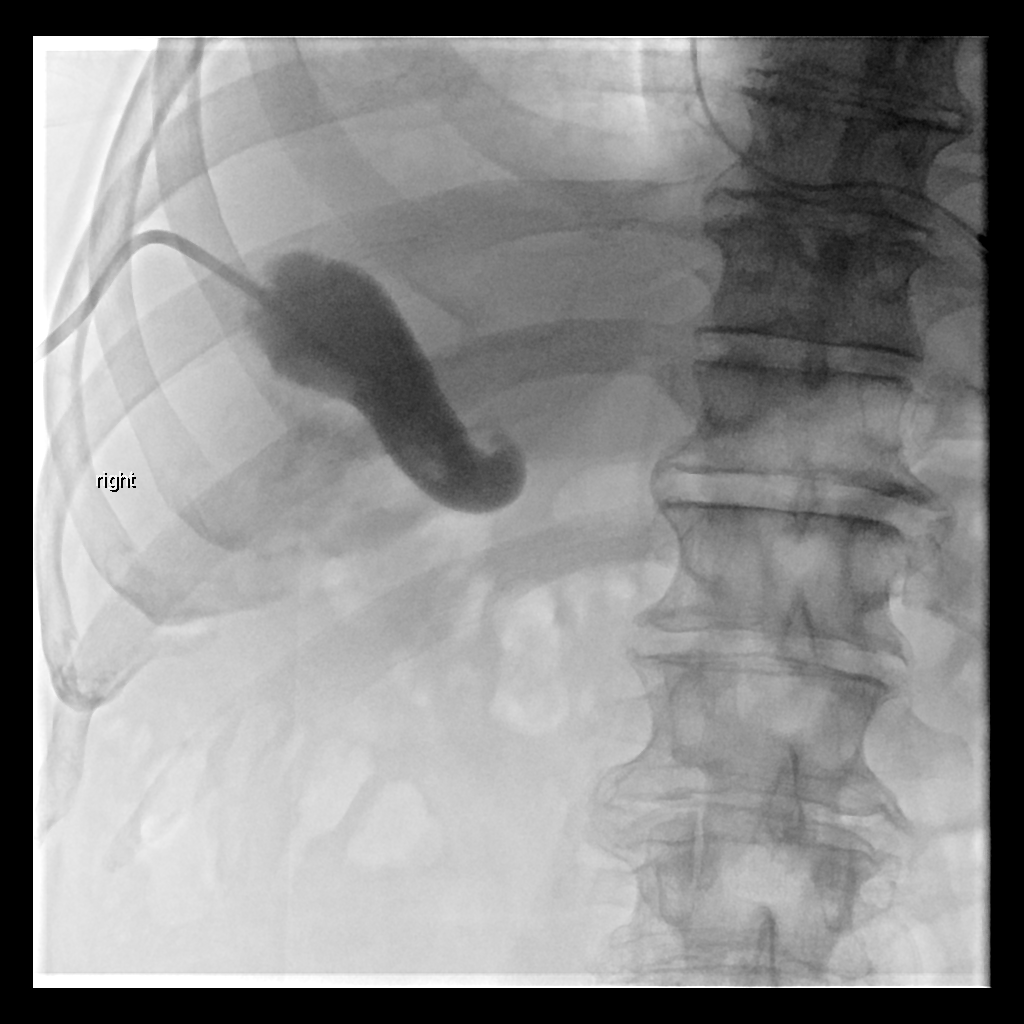

[Series 3: fl angio · 2 of 2 slices shown (2 of 2)]
[im 1/2]
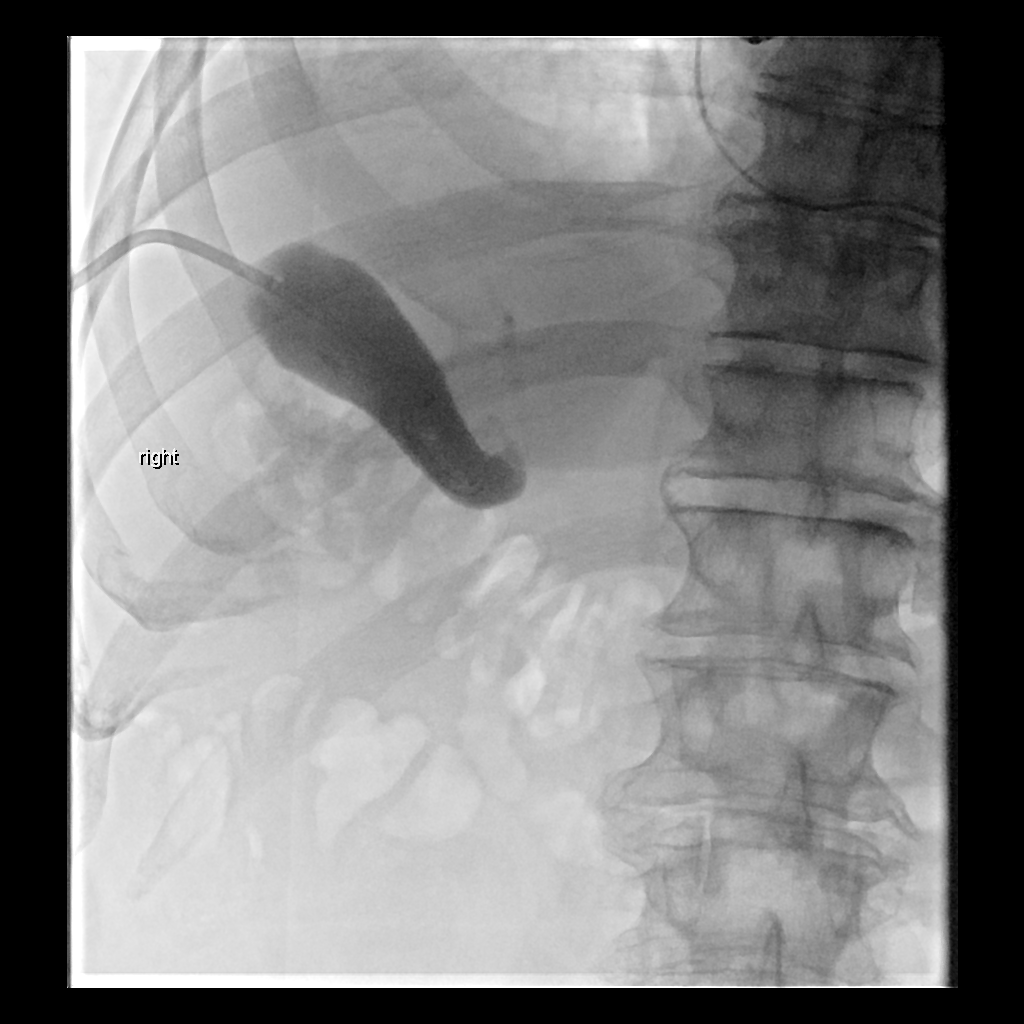
[im 2/2]
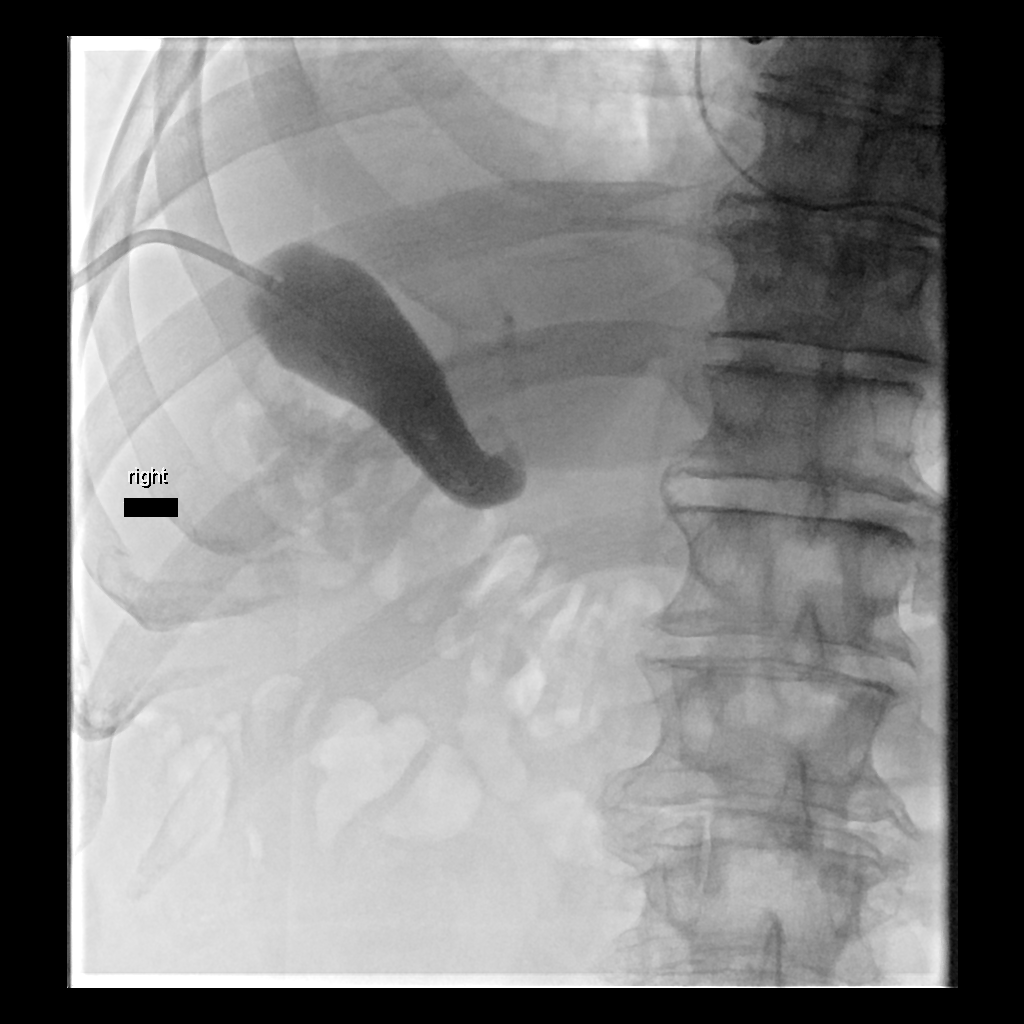

[4 of 4 positions shown; findings below may reference images not displayed]

Today, I discussed at length the options of removing of the tube
with the possible risk of re- current cholecystitis versus keeping
the tube in place capped and undergoing routine tube check and
change every 8-10 weeks. He has found the tube to be of a little
bother and elects to proceed with maintaining the tube in place.
Therefore, we will change the tube today.

EXAM:
Cholangiogram through existing catheter and percutaneous
cholecystostomy tube exchange

MEDICATIONS:
None

ANESTHESIA/SEDATION:
None

FLUOROSCOPY TIME:  Fluoroscopy Time: 1 minutes 0 seconds (13 mGy).

COMPLICATIONS:
None immediate.

PROCEDURE:
Informed written consent was obtained from the patient after a
thorough discussion of the procedural risks, benefits and
alternatives. All questions were addressed. Maximal Sterile Barrier
Technique was utilized including caps, mask, sterile gowns, sterile
gloves, sterile drape, hand hygiene and skin antiseptic. A timeout
was performed prior to the initiation of the procedure.

A gentle hand injection of contrast material opacifies the bladder.
There are internal filling defects consistent with cholelithiasis.
The gallbladder was not pressurized, but a small amount of contrast
can be seen within the intrahepatic ducts indicating patency of the
cystic duct.

The existing cholecystostomy tube was cut and removed over an
Amplatz wire. A new 10.2 French percutaneous cholecystostomy tube
was advanced over the wire and formed in the gallbladder lumen. An
image was saved for the medical record documenting in the tube
position. The tube was secured to the skin with 0 Prolene suture and
an adhesive fixation device. A sterile bandage was applied.
IMPRESSION: Successful exchange of percutaneous cholecystostomy tube.

Patient has persistent cholelithiasis.

PLAN:
Tube will remain in place for the foreseeable future. Patient will
need to undergo routine tube check and exchange every 8-10 weeks.

## 2017-11-08 ENCOUNTER — Ambulatory Visit (HOSPITAL_COMMUNITY)
Admission: RE | Admit: 2017-11-08 | Discharge: 2017-11-08 | Disposition: A | Discharge status: Home / Self Care | Payer: Medicare Other | Source: Ambulatory Visit | Attending: Interventional Radiology | Admitting: Interventional Radiology

## 2017-11-08 ENCOUNTER — Other Ambulatory Visit (HOSPITAL_COMMUNITY): Payer: Self-pay | Admitting: Interventional Radiology

## 2017-11-08 ENCOUNTER — Other Ambulatory Visit (HOSPITAL_COMMUNITY): Payer: Medicare Other

## 2017-11-08 ENCOUNTER — Encounter (HOSPITAL_COMMUNITY): Payer: Self-pay | Admitting: Interventional Radiology

## 2017-11-08 DIAGNOSIS — K819 Cholecystitis, unspecified: Secondary | ICD-10-CM

## 2017-11-08 DIAGNOSIS — K831 Obstruction of bile duct: Secondary | ICD-10-CM | POA: Insufficient documentation

## 2017-11-08 DIAGNOSIS — Z4682 Encounter for fitting and adjustment of non-vascular catheter: Secondary | ICD-10-CM | POA: Insufficient documentation

## 2017-11-08 HISTORY — PX: IR EXCHANGE BILIARY DRAIN: IMG6046

## 2017-11-08 MED ORDER — IOPAMIDOL (ISOVUE-300) INJECTION 61%
INTRAVENOUS | Status: AC
  Administered 2017-11-08: 10 mL
  Filled 2017-11-08: qty 50

## 2017-11-08 MED ORDER — LIDOCAINE HCL 1 % IJ SOLN
INTRAMUSCULAR | Status: DC | PRN
  Administered 2017-11-08: 10 mL

## 2017-11-08 MED ORDER — SODIUM CHLORIDE 0.9% FLUSH: 5.0000 mL | Freq: Three times a day (TID) | INTRAVENOUS | Status: DC

## 2017-11-08 MED ORDER — LIDOCAINE HCL 1 % IJ SOLN
INTRAMUSCULAR | Status: AC
  Filled 2017-11-08: qty 20

## 2017-11-08 NOTE — Procedures (Signed)
Interventional Radiology Procedure Note  Procedure: Routine exchange of cholecystostomy tube. 64F drain. .  Complications: None Recommendations:  - DC now - Do not submergee - Routine care   Signed,  Yvone NeuJaime S. Loreta AveWagner, DO

## 2018-01-03 ENCOUNTER — Ambulatory Visit (HOSPITAL_COMMUNITY)
Admission: RE | Admit: 2018-01-03 | Discharge: 2018-01-03 | Disposition: A | Discharge status: Home / Self Care | Payer: Medicare Other | Source: Ambulatory Visit | Attending: Interventional Radiology | Admitting: Interventional Radiology

## 2018-01-03 ENCOUNTER — Encounter (HOSPITAL_COMMUNITY): Payer: Self-pay | Admitting: Diagnostic Radiology

## 2018-01-03 ENCOUNTER — Other Ambulatory Visit (HOSPITAL_COMMUNITY): Payer: Self-pay | Admitting: Interventional Radiology

## 2018-01-03 DIAGNOSIS — Z4682 Encounter for fitting and adjustment of non-vascular catheter: Secondary | ICD-10-CM | POA: Insufficient documentation

## 2018-01-03 DIAGNOSIS — K819 Cholecystitis, unspecified: Secondary | ICD-10-CM | POA: Diagnosis not present

## 2018-01-03 HISTORY — PX: IR EXCHANGE BILIARY DRAIN: IMG6046

## 2018-01-03 MED ORDER — IOPAMIDOL (ISOVUE-300) INJECTION 61%
INTRAVENOUS | Status: AC
  Administered 2018-01-03: 15 mL
  Filled 2018-01-03: qty 50

## 2018-01-03 MED ORDER — LIDOCAINE HCL 1 % IJ SOLN
INTRAMUSCULAR | Status: DC | PRN
  Administered 2018-01-03: 10 mL

## 2018-01-03 MED ORDER — LIDOCAINE HCL 1 % IJ SOLN
INTRAMUSCULAR | Status: AC
  Filled 2018-01-03: qty 20

## 2018-01-03 NOTE — Procedures (Signed)
Routine exchange of cholecystostomy tube.  Cystic duct and CBD are patent.  No blood loss and no immediate complication.  See full report in Imaging.  Plan for another routine exchange in 8 weeks.

## 2018-02-28 ENCOUNTER — Ambulatory Visit (HOSPITAL_COMMUNITY)
Admission: RE | Admit: 2018-02-28 | Discharge: 2018-02-28 | Disposition: A | Discharge status: Home / Self Care | Payer: Medicare Other | Source: Ambulatory Visit | Attending: Interventional Radiology | Admitting: Interventional Radiology

## 2018-02-28 ENCOUNTER — Encounter (HOSPITAL_COMMUNITY): Payer: Self-pay | Admitting: Interventional Radiology

## 2018-02-28 ENCOUNTER — Other Ambulatory Visit (HOSPITAL_COMMUNITY): Payer: Self-pay | Admitting: Interventional Radiology

## 2018-02-28 DIAGNOSIS — K819 Cholecystitis, unspecified: Secondary | ICD-10-CM

## 2018-02-28 DIAGNOSIS — Z4682 Encounter for fitting and adjustment of non-vascular catheter: Secondary | ICD-10-CM | POA: Insufficient documentation

## 2018-02-28 DIAGNOSIS — K8011 Calculus of gallbladder with chronic cholecystitis with obstruction: Secondary | ICD-10-CM | POA: Insufficient documentation

## 2018-02-28 HISTORY — PX: IR EXCHANGE BILIARY DRAIN: IMG6046

## 2018-02-28 MED ORDER — LIDOCAINE HCL 1 % IJ SOLN
INTRAMUSCULAR | Status: AC
  Filled 2018-02-28: qty 20

## 2018-02-28 MED ORDER — SODIUM CHLORIDE 0.9% FLUSH: 5.0000 mL | Freq: Three times a day (TID) | INTRAVENOUS | Status: DC

## 2018-02-28 MED ORDER — LIDOCAINE HCL (PF) 1 % IJ SOLN
INTRAMUSCULAR | Status: AC | PRN
  Administered 2018-02-28: 10 mL

## 2018-02-28 MED ORDER — IOPAMIDOL (ISOVUE-300) INJECTION 61%
INTRAVENOUS | Status: AC
  Administered 2018-02-28: 10 mL
  Filled 2018-02-28: qty 50

## 2018-02-28 NOTE — Procedures (Signed)
Interventional Radiology Procedure Note  Procedure: Image guided exchange of perc chole, with new 103F drain placed.  The cystic duct remains blocked.  .  Complications: None  Recommendations:  - to gravity drain - Do not submerge - Routine care   Signed,  Yvone NeuJaime S. Loreta AveWagner, DO

## 2018-04-25 ENCOUNTER — Other Ambulatory Visit (HOSPITAL_COMMUNITY): Payer: Self-pay | Admitting: Interventional Radiology

## 2018-04-25 ENCOUNTER — Ambulatory Visit (HOSPITAL_COMMUNITY)
Admission: RE | Admit: 2018-04-25 | Discharge: 2018-04-25 | Disposition: A | Discharge status: Home / Self Care | Payer: Medicare Other | Source: Ambulatory Visit | Attending: Interventional Radiology | Admitting: Interventional Radiology

## 2018-04-25 ENCOUNTER — Encounter (HOSPITAL_COMMUNITY): Payer: Self-pay | Admitting: Interventional Radiology

## 2018-04-25 DIAGNOSIS — K819 Cholecystitis, unspecified: Secondary | ICD-10-CM | POA: Diagnosis not present

## 2018-04-25 DIAGNOSIS — Z4803 Encounter for change or removal of drains: Secondary | ICD-10-CM | POA: Insufficient documentation

## 2018-04-25 DIAGNOSIS — Z434 Encounter for attention to other artificial openings of digestive tract: Secondary | ICD-10-CM

## 2018-04-25 HISTORY — PX: IR EXCHANGE BILIARY DRAIN: IMG6046

## 2018-04-25 MED ORDER — LIDOCAINE HCL 1 % IJ SOLN
INTRAMUSCULAR | Status: AC
  Filled 2018-04-25: qty 20

## 2018-04-25 MED ORDER — IOPAMIDOL (ISOVUE-300) INJECTION 61%
INTRAVENOUS | Status: AC
  Administered 2018-04-25: 10 mL
  Filled 2018-04-25: qty 50

## 2018-04-25 MED ORDER — LIDOCAINE HCL 1 % IJ SOLN
INTRAMUSCULAR | Status: AC | PRN
  Administered 2018-04-25: 5 mL

## 2018-04-25 NOTE — Procedures (Signed)
Chronic calc cholecystitis  S/p fluoro cholecystostomy tube exchg  No comp Stable ebl 0 Full report in pacs

## 2018-06-26 ENCOUNTER — Other Ambulatory Visit (HOSPITAL_COMMUNITY): Payer: Medicare (Managed Care)

## 2018-08-01 ENCOUNTER — Encounter (HOSPITAL_COMMUNITY): Payer: Self-pay

## 2018-08-01 ENCOUNTER — Other Ambulatory Visit: Payer: Self-pay

## 2018-08-01 ENCOUNTER — Ambulatory Visit (HOSPITAL_COMMUNITY)
Admission: RE | Admit: 2018-08-01 | Discharge: 2018-08-01 | Disposition: A | Discharge status: Home / Self Care | Payer: Medicare Other | Source: Ambulatory Visit | Attending: Interventional Radiology | Admitting: Interventional Radiology

## 2018-08-01 ENCOUNTER — Other Ambulatory Visit (HOSPITAL_COMMUNITY): Payer: Self-pay | Admitting: Interventional Radiology

## 2018-08-01 DIAGNOSIS — Z434 Encounter for attention to other artificial openings of digestive tract: Secondary | ICD-10-CM

## 2018-08-01 DIAGNOSIS — Z4803 Encounter for change or removal of drains: Secondary | ICD-10-CM | POA: Insufficient documentation

## 2018-08-01 HISTORY — PX: IR EXCHANGE BILIARY DRAIN: IMG6046

## 2018-08-01 MED ORDER — IOHEXOL 300 MG/ML  SOLN
50.0000 mL | Freq: Once | INTRAMUSCULAR | Status: AC | PRN
  Administered 2018-08-01: 15 mL

## 2018-08-01 MED ORDER — LIDOCAINE HCL 1 % IJ SOLN
INTRAMUSCULAR | Status: DC | PRN
  Administered 2018-08-01: 8 mL

## 2018-08-01 MED ORDER — LIDOCAINE HCL 1 % IJ SOLN
INTRAMUSCULAR | Status: AC
  Filled 2018-08-01: qty 20

## 2018-08-01 NOTE — Procedures (Signed)
Chronic cholecystitis  S/p perc cholecystostomy exchg  No comp Stable ebl 0

## 2018-09-26 ENCOUNTER — Inpatient Hospital Stay (HOSPITAL_COMMUNITY): Admission: RE | Admit: 2018-09-26 | Payer: Medicare (Managed Care) | Source: Ambulatory Visit

## 2018-10-19 ENCOUNTER — Other Ambulatory Visit (HOSPITAL_COMMUNITY): Payer: Self-pay | Admitting: Interventional Radiology

## 2018-10-19 ENCOUNTER — Other Ambulatory Visit: Payer: Self-pay

## 2018-10-19 ENCOUNTER — Encounter (HOSPITAL_COMMUNITY): Payer: Self-pay | Admitting: Diagnostic Radiology

## 2018-10-19 ENCOUNTER — Ambulatory Visit (HOSPITAL_COMMUNITY)
Admission: RE | Admit: 2018-10-19 | Discharge: 2018-10-19 | Disposition: A | Discharge status: Home / Self Care | Payer: Medicare Other | Source: Ambulatory Visit | Attending: Interventional Radiology | Admitting: Interventional Radiology

## 2018-10-19 DIAGNOSIS — Z434 Encounter for attention to other artificial openings of digestive tract: Secondary | ICD-10-CM | POA: Insufficient documentation

## 2018-10-19 HISTORY — PX: IR EXCHANGE BILIARY DRAIN: IMG6046

## 2018-10-19 MED ORDER — LIDOCAINE HCL 1 % IJ SOLN
INTRAMUSCULAR | Status: AC
  Filled 2018-10-19: qty 20

## 2018-10-19 MED ORDER — IOHEXOL 300 MG/ML  SOLN
50.0000 mL | Freq: Once | INTRAMUSCULAR | Status: AC | PRN
  Administered 2018-10-19: 12:00:00 10 mL

## 2018-10-19 MED ORDER — LIDOCAINE HCL 1 % IJ SOLN
INTRAMUSCULAR | Status: DC | PRN
  Administered 2018-10-19: 5 mL

## 2018-10-19 NOTE — Procedures (Signed)
Interventional Radiology Procedure:   Indications: Chronic cholecystostomy tube   Procedure: Cholecystostomy tube exchange  Findings: Orange bile,uncertain etiology.  Tube in gallbladder.  Gallstones.  Complications: none     EBL: less than 10 ml  Plan: Plan for routine exchange.     Che Rachal R. Anselm Pancoast, MD  Pager: [phone]

## 2018-11-22 ENCOUNTER — Other Ambulatory Visit (HOSPITAL_COMMUNITY): Payer: Self-pay | Admitting: Interventional Radiology

## 2018-11-22 DIAGNOSIS — Z434 Encounter for attention to other artificial openings of digestive tract: Secondary | ICD-10-CM

## 2018-11-24 ENCOUNTER — Other Ambulatory Visit (HOSPITAL_COMMUNITY): Payer: Self-pay | Admitting: Interventional Radiology

## 2018-11-24 ENCOUNTER — Ambulatory Visit (HOSPITAL_COMMUNITY)
Admission: RE | Admit: 2018-11-24 | Discharge: 2018-11-24 | Disposition: A | Discharge status: Home / Self Care | Payer: Medicare Other | Source: Ambulatory Visit | Attending: Interventional Radiology | Admitting: Interventional Radiology

## 2018-11-24 ENCOUNTER — Other Ambulatory Visit: Payer: Self-pay

## 2018-11-24 DIAGNOSIS — Z434 Encounter for attention to other artificial openings of digestive tract: Secondary | ICD-10-CM

## 2018-11-24 DIAGNOSIS — Z4803 Encounter for change or removal of drains: Secondary | ICD-10-CM | POA: Diagnosis present

## 2018-11-24 HISTORY — PX: IR EXCHANGE BILIARY DRAIN: IMG6046

## 2018-11-24 MED ORDER — LIDOCAINE HCL 1 % IJ SOLN
INTRAMUSCULAR | Status: DC | PRN
  Administered 2018-11-24: 5 mL

## 2018-11-24 MED ORDER — LIDOCAINE HCL 1 % IJ SOLN
INTRAMUSCULAR | Status: AC
  Filled 2018-11-24: qty 20

## 2018-11-24 MED ORDER — IOHEXOL 300 MG/ML  SOLN
50.0000 mL | Freq: Once | INTRAMUSCULAR | Status: AC | PRN
  Administered 2018-11-24: 15:00:00 5 mL

## 2018-11-24 NOTE — Progress Notes (Signed)
Patient presents to IR today due to leakage around his cholecystostomy tube. Denies fever, chills, nausea, vomiting, abdominal pain.  Assessed by Dr. Earleen Newport who recommends exchange with possible upsize today.   Son provides consent as patient is non-verbal.   Brynda Greathouse, MS RD PA-C

## 2018-11-24 NOTE — Procedures (Signed)
Interventional Radiology Procedure Note  Procedure:  Routine perc chole exchange.  New 46F drain to gravity. .  Complications: None Recommendations:  - To gravity - Do not submerge - Routine care   Signed,  Dulcy Fanny. Earleen Newport, DO

## 2018-11-28 ENCOUNTER — Encounter (HOSPITAL_COMMUNITY): Payer: Self-pay | Admitting: Interventional Radiology

## 2019-01-11 ENCOUNTER — Other Ambulatory Visit (HOSPITAL_COMMUNITY): Payer: Medicare (Managed Care)

## 2019-01-23 DEATH — deceased

## 2019-02-12 ENCOUNTER — Inpatient Hospital Stay (HOSPITAL_COMMUNITY): Admission: RE | Admit: 2019-02-12 | Payer: Medicare Other | Source: Ambulatory Visit
